# Patient Record
Sex: Female | Born: 1970 | Hispanic: No | Marital: Married | Smoking: Never smoker
Health system: Southern US, Community
[De-identification: ages and names within clinical notes are randomized; demographics above are authoritative.]

## PROBLEM LIST (undated history)

## (undated) DIAGNOSIS — M358 Other specified systemic involvement of connective tissue: Secondary | ICD-10-CM

## (undated) DIAGNOSIS — D649 Anemia, unspecified: Secondary | ICD-10-CM

## (undated) DIAGNOSIS — M3500 Sicca syndrome, unspecified: Secondary | ICD-10-CM

## (undated) DIAGNOSIS — K743 Primary biliary cirrhosis: Secondary | ICD-10-CM

## (undated) DIAGNOSIS — E785 Hyperlipidemia, unspecified: Secondary | ICD-10-CM

## (undated) DIAGNOSIS — L940 Localized scleroderma [morphea]: Secondary | ICD-10-CM

## (undated) HISTORY — DX: Hyperlipidemia, unspecified: E78.5

## (undated) HISTORY — PX: TONSILLECTOMY: SUR1361

## (undated) HISTORY — PX: OTHER SURGICAL HISTORY: SHX169

## (undated) HISTORY — DX: Anemia, unspecified: D64.9

## (undated) HISTORY — DX: Other specified systemic involvement of connective tissue: M35.8

## (undated) HISTORY — DX: Localized scleroderma (morphea): L94.0

## (undated) HISTORY — DX: Sjogren syndrome, unspecified: M35.00

## (undated) HISTORY — PX: CHOLECYSTECTOMY: SHX55

## (undated) HISTORY — DX: Primary biliary cirrhosis: K74.3

---

## 2007-02-15 ENCOUNTER — Ambulatory Visit: Payer: Self-pay

## 2007-02-22 ENCOUNTER — Ambulatory Visit: Payer: Self-pay | Admitting: Obstetrics & Gynecology

## 2007-04-06 LAB — CONVERTED CEMR LAB
Pap Smear: NORMAL
Pap Smear: NORMAL
Pap Smear: NORMAL

## 2008-10-16 ENCOUNTER — Ambulatory Visit: Payer: Self-pay | Admitting: Family Medicine

## 2008-10-16 DIAGNOSIS — E669 Obesity, unspecified: Secondary | ICD-10-CM | POA: Insufficient documentation

## 2008-10-28 IMAGING — CT CT ABD-PELV W/ CM
1 of 2 series · 15 of 32 positions shown, 19 images · non-contrast
Comparison: none

REASON FOR EXAM: Fever Vomiting Eval Appendicitis Call Report [id number] or
after 5 Cell [id number]...
COMMENTS:

[Series 2: appendicitis · axial · 0.78mm/px · z∈[-1048,-580]mm · 15 of 172 slices shown, 19 images]
[im 8/172  soft-tissue]
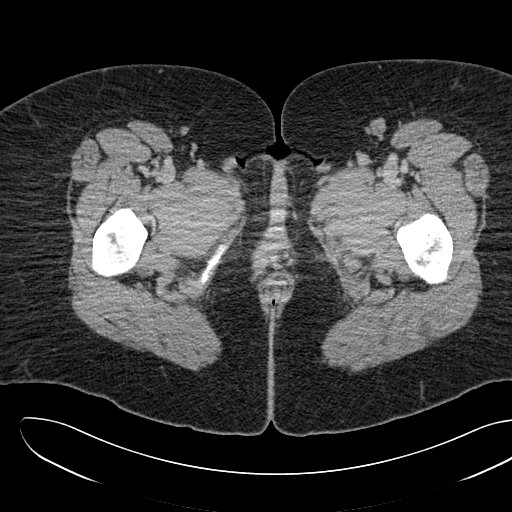
[im 8/172  bone]
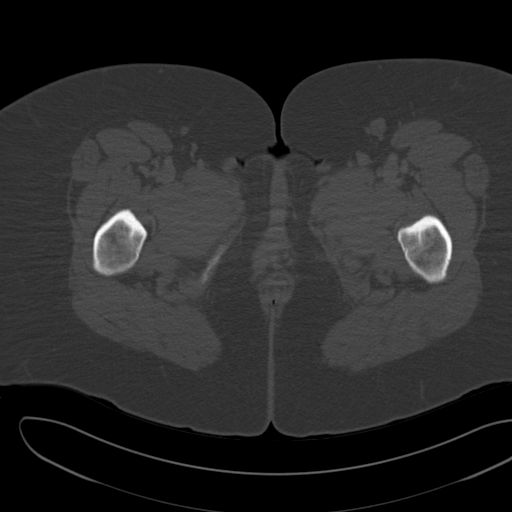
[im 23/172  soft-tissue]
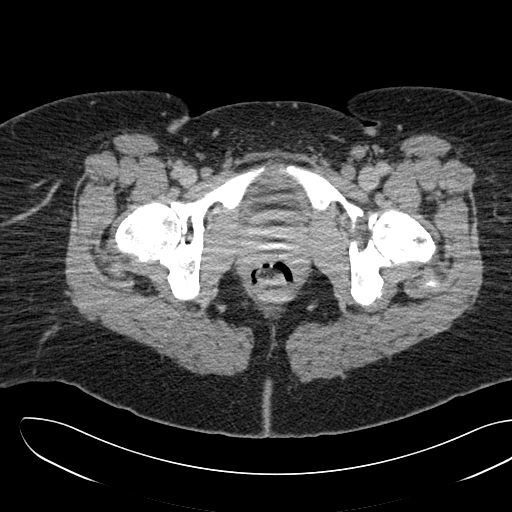
[im 38/172  soft-tissue]
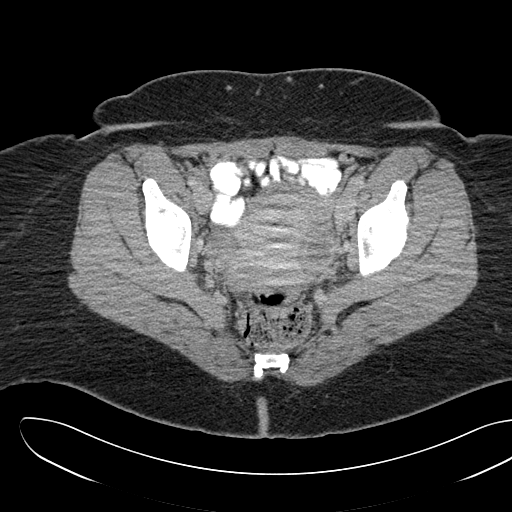
[im 45/172  soft-tissue]
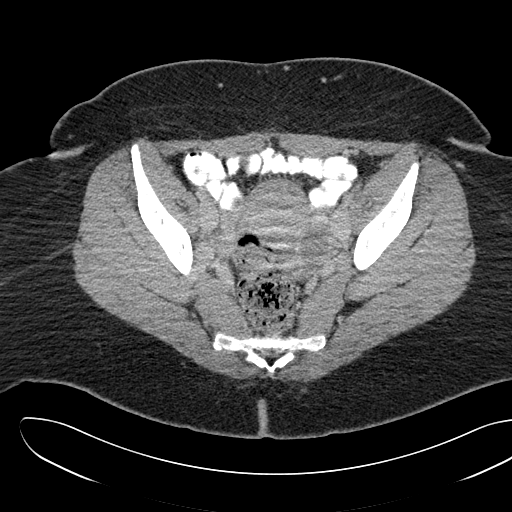
[im 60/172  soft-tissue]
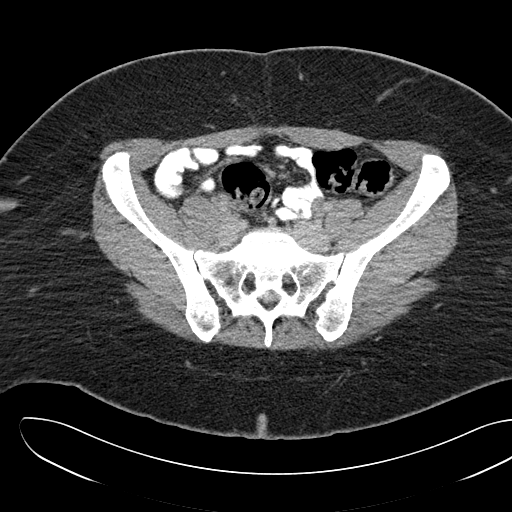
[im 75/172  soft-tissue]
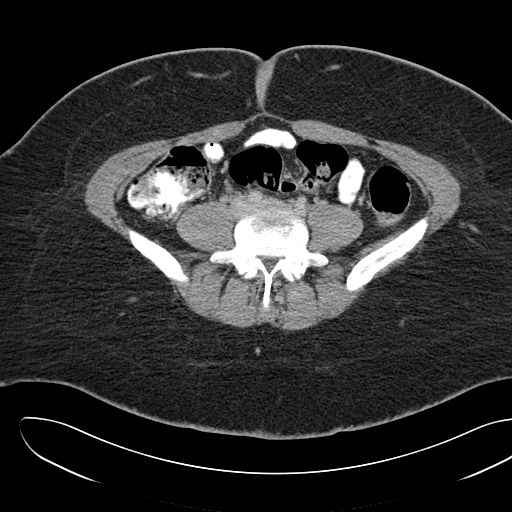
[im 90/172  soft-tissue]
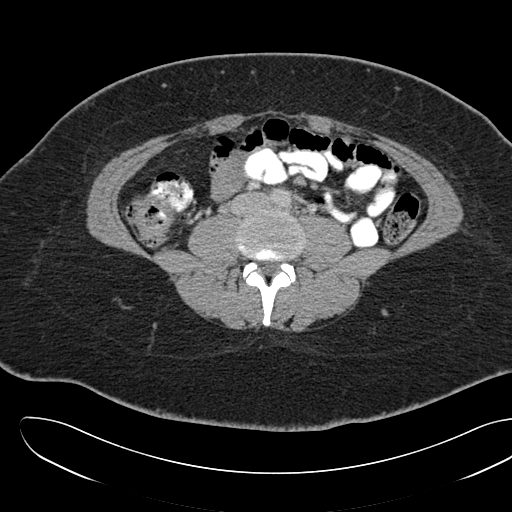
[im 97/172  soft-tissue]
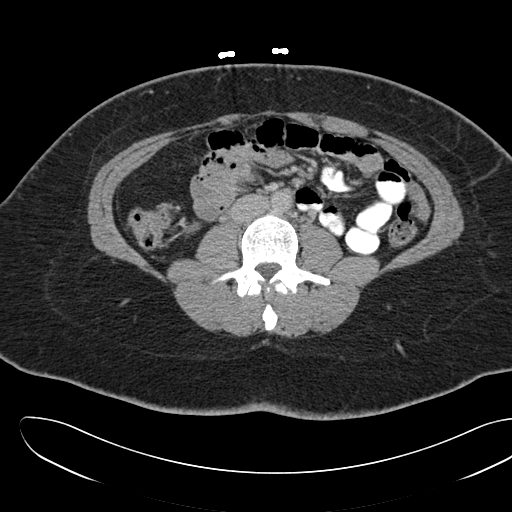
[im 112/172  soft-tissue]
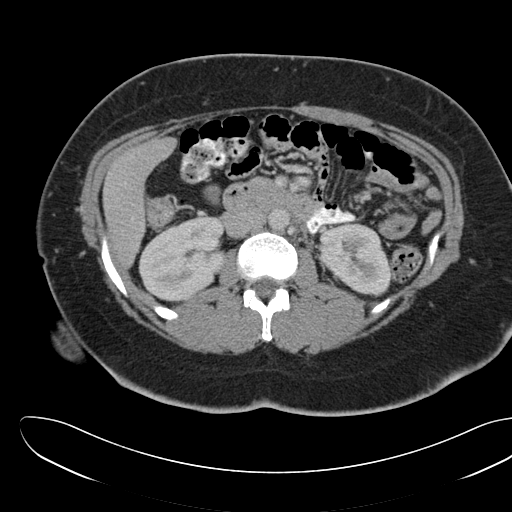
[im 112/172  bone]
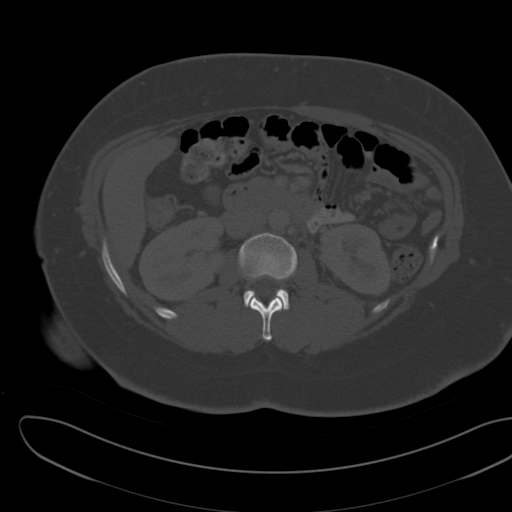
[im 127/172  soft-tissue]
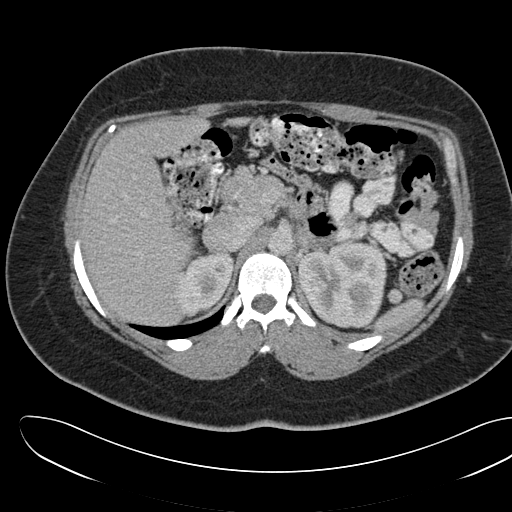
[im 134/172  soft-tissue]
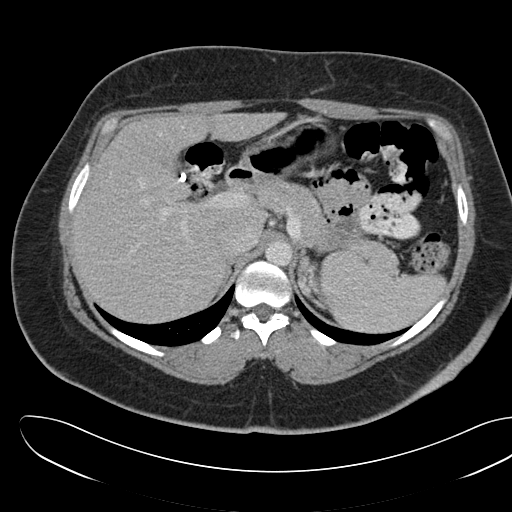
[im 142/172  lung]
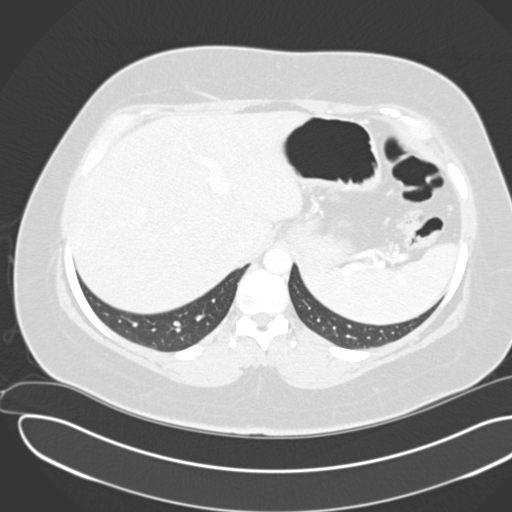
[im 149/172  soft-tissue]
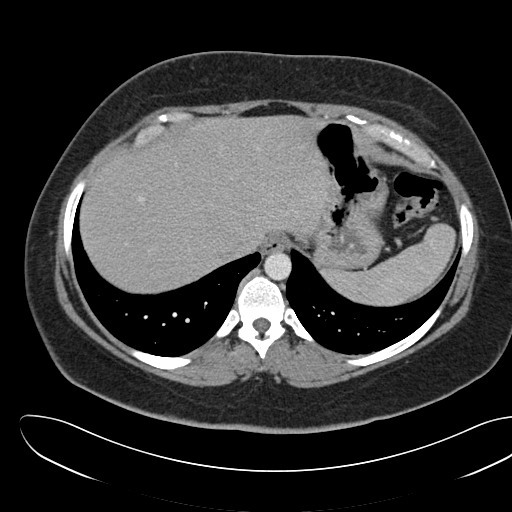
[im 149/172  lung]
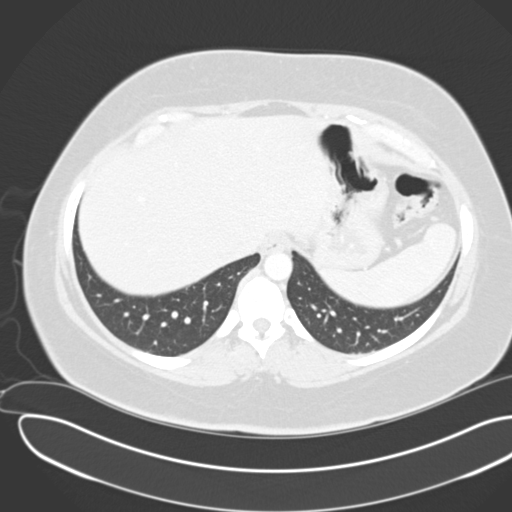
[im 157/172  lung]
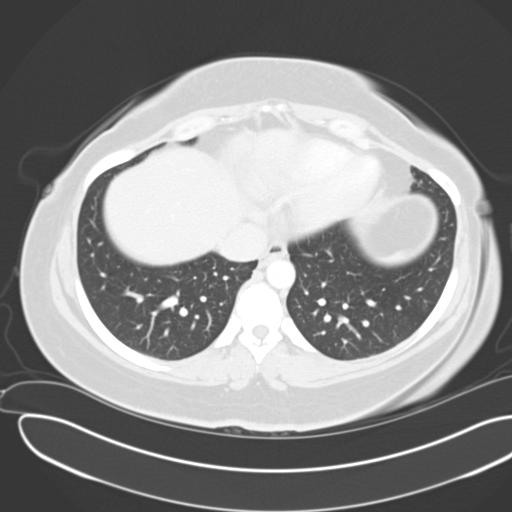
[im 164/172  soft-tissue]
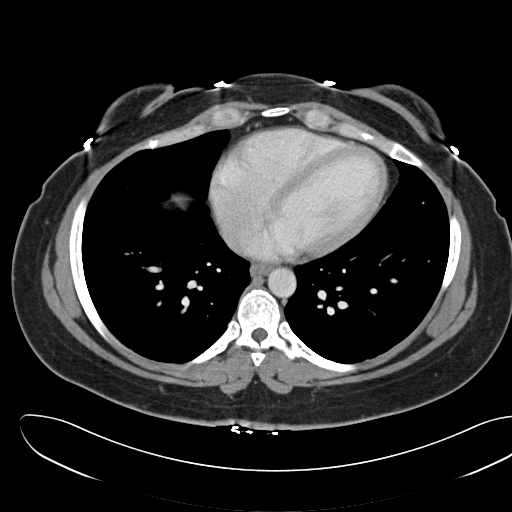
[im 164/172  lung]
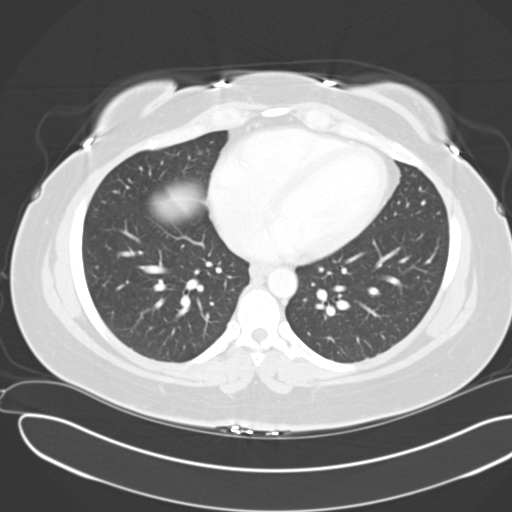

[15 of 32 positions shown; findings below may reference images not displayed]

PROCEDURE:     CT  - CT ABDOMEN / PELVIS  W  - February 15, 2007  [DATE]

RESULT:      Helical 3 mm sections were obtained from the lung bases through
the pubic symphysis status post intravenous administration of 100 ml of
Nsovue-S3U and oral contrast.

Evaluation of the lung bases demonstrates no gross abnormalities.

The liver, spleen and adrenals are unremarkable. Evaluation of the LEFT
kidney demonstrates a 2-3 mm nonobstructing calculus within the inferior
anterior medullary portion of the kidney. No further RIGHT or LEFT renal
calculi or masses are identified. There is no evidence of upper abdominal
free fluid or drainable loculated fluid collections, masses or adenopathy.
No evidence of an abdominal aortic aneurysm is appreciated. Evaluation of
the RIGHT pericolic gutter region demonstrates no CT evidence reflecting
appendicitis. There does not appear to be CT evidence of colitis,
diverticulitis, bowel obstruction or enteritis. A focal low attenuated area
projects within the LEFT adnexal region measuring 3.07 x 3.76 cm likely
representing an ovarian cyst. Pelvic evaluation is degraded secondary to
patient body habitus. No further gross masses, free fluid or drainable
loculated fluid collections are otherwise appreciated within the pelvis.
IMPRESSION: No CT evidence reflecting the sequela of appendicitis,
diverticulitis or colitis. There are findings which appear to be consistent
with a LEFT ovarian cyst.

## 2009-01-29 ENCOUNTER — Ambulatory Visit: Payer: Self-pay | Admitting: Family Medicine

## 2009-01-29 LAB — CONVERTED CEMR LAB
ALT: 19 units/L (ref 0–35)
AST: 18 units/L (ref 0–37)
Bilirubin, Direct: 0 mg/dL (ref 0.0–0.3)
CO2: 29 meq/L (ref 19–32)
Chloride: 109 meq/L (ref 96–112)
Cholesterol: 179 mg/dL (ref 0–200)
Creatinine, Ser: 0.6 mg/dL (ref 0.4–1.2)
Potassium: 4 meq/L (ref 3.5–5.1)
Total CHOL/HDL Ratio: 4
Total Protein: 7.3 g/dL (ref 6.0–8.3)
Triglycerides: 43 mg/dL (ref 0.0–149.0)

## 2009-02-12 ENCOUNTER — Other Ambulatory Visit: Admission: RE | Admit: 2009-02-12 | Discharge: 2009-02-12 | Payer: Self-pay | Admitting: Family Medicine

## 2009-02-12 ENCOUNTER — Encounter: Payer: Self-pay | Admitting: Family Medicine

## 2009-02-12 ENCOUNTER — Ambulatory Visit: Payer: Self-pay | Admitting: Family Medicine

## 2009-02-12 DIAGNOSIS — R5383 Other fatigue: Secondary | ICD-10-CM

## 2009-02-12 DIAGNOSIS — B373 Candidiasis of vulva and vagina: Secondary | ICD-10-CM

## 2009-02-12 DIAGNOSIS — B3731 Acute candidiasis of vulva and vagina: Secondary | ICD-10-CM | POA: Insufficient documentation

## 2009-02-12 DIAGNOSIS — R5381 Other malaise: Secondary | ICD-10-CM | POA: Insufficient documentation

## 2009-02-15 LAB — CONVERTED CEMR LAB
Basophils Absolute: 0 10*3/uL (ref 0.0–0.1)
Basophils Relative: 0 % (ref 0–1)
Eosinophils Absolute: 0 10*3/uL (ref 0.0–0.7)
Eosinophils Relative: 0 % (ref 0–5)
Lymphs Abs: 1.5 10*3/uL (ref 0.7–4.0)
MCHC: 32.6 g/dL (ref 30.0–36.0)
MCV: 88 fL (ref 78.0–100.0)
Neutrophils Relative %: 64 % (ref 43–77)
Platelets: 231 10*3/uL (ref 150–400)
RDW: 14.3 % (ref 11.5–15.5)
WBC: 5.3 10*3/uL (ref 4.0–10.5)

## 2009-02-23 ENCOUNTER — Encounter (INDEPENDENT_AMBULATORY_CARE_PROVIDER_SITE_OTHER): Payer: Self-pay | Admitting: *Deleted

## 2009-07-29 ENCOUNTER — Ambulatory Visit: Payer: Self-pay | Admitting: Family Medicine

## 2009-07-29 DIAGNOSIS — J020 Streptococcal pharyngitis: Secondary | ICD-10-CM | POA: Insufficient documentation

## 2009-07-29 DIAGNOSIS — IMO0002 Reserved for concepts with insufficient information to code with codable children: Secondary | ICD-10-CM | POA: Insufficient documentation

## 2010-07-05 NOTE — Assessment & Plan Note (Signed)
Summary: BACK PAIN/CLE   Vital Signs:  Patient profile:   40 year old female Weight:      284 pounds Temp:     98.7 degrees F oral Pulse rate:   72 / minute Pulse rhythm:   regular BP sitting:   130 / 80  (left arm) Cuff size:   large  Vitals Entered By: Sydell Axon LPN (July 29, 2009 3:59 PM) CC: Woke up Sunday morning with back pain, has been using ice and Aleve   History of Present Illness: Pt here  for low back pain and right radiculopathy into posterior thigh. She has never had back pain before. This started Sun. She is overweight and we discussed the pathophysiology of that and COG. She had been catrrying her two toddler grandchildren on each hip most of Sat as eveeryone else was sick or unavailabloe to care for them. She felt well when she went to bed Sat nite buthad signif discomfort upon arising Sun AM. She had done nothing else unusual, did not fall and has had no trauma. She has been doing nothing and taking nothing for it.  Problems Prior to Update: 1)  Routine Gynecological Examination  (ICD-V72.31) 2)  Physical Examination  (ICD-V70.0) 3)  Vaginitis, Candidal  (ICD-112.1) 4)  Fatigue  (ICD-780.79) 5)  Morbid Obesity  (ICD-278.01)  Medications Prior to Update: 1)  Adipex-P 37.5 Mg Caps (Phentermine Hcl) .... Take 1 Tablet By Mouth Once A Day 2)  Multivitamins   Tabs (Multiple Vitamin) .... Take 1 Tablet By Mouth Once A Day 3)  Amoxicillin 500 Mg Caps (Amoxicillin) .... Since 02-05-2009 For Infection 4)  Fluconazole 150 Mg Tabs (Fluconazole) .Marland Kitchen.. 1 Tab By Mouth X 1  Allergies: No Known Drug Allergies  Physical Exam  General:  Well-developed,well-nourished,in no acute distress; alert,appropriate and cooperative throughout examination, obese and leaning over the exam table when I entered the room. Msk:  Very difficult for her to stand up straight. She can get upright with somer distress. ROM at waist to 90 degrees in flexion, just upright in extension, lat bend  left to 30 degrees, right to 5 degrees at most, twist to 30 degrees bilat. Barely able to get on exam table but unable to sit any time, declined trying to lay down. Descibes pain like "pins and needles" into the posterior right thigh. Neurologic:  Unable to assess.   Impression & Recommendations:  Problem # 1:  BACK PAIN, LUMBAR, WITH RADICULOPATHY (ICD-724.4) Assessment New  Long discussion of pathophysiology and trmt. Use IBP and Flexeril three times a day together. Use heat during the day and ice at night. Start exercises shown when able. Take Vicodin as needed. RTC if sxs worsen. The following medications were removed from the medication list:    Aleve 220 Mg Tabs (Naproxen sodium) .Marland Kitchen... As needed Her updated medication list for this problem includes:    Vicodin 5-500 Mg Tabs (Hydrocodone-acetaminophen) ..... One tab by mouth three times a day as needed    Cyclobenzaprine Hcl 10 Mg Tabs (Cyclobenzaprine hcl) ..... One tab by mouth three times a day    Ibuprofen 800 Mg Tabs (Ibuprofen) ..... One tab by mouth three times a day with food.  Complete Medication List: 1)  Adipex-p 37.5 Mg Caps (Phentermine hcl) .... Take 1 tablet by mouth once a day 2)  Multivitamins Tabs (Multiple vitamin) .... Take 1 tablet by mouth once a day 3)  Fluconazole 150 Mg Tabs (Fluconazole) .Marland Kitchen.. 1 tab by mouth x 1 4)  Vicodin 5-500 Mg Tabs (Hydrocodone-acetaminophen) .... One tab by mouth three times a day as needed 5)  Cyclobenzaprine Hcl 10 Mg Tabs (Cyclobenzaprine hcl) .... One tab by mouth three times a day 6)  Ibuprofen 800 Mg Tabs (Ibuprofen) .... One tab by mouth three times a day with food. Prescriptions: IBUPROFEN 800 MG TABS (IBUPROFEN) one tab by mouth three times a day with food.  #90 x 0   Entered and Authorized by:   Shaune Leeks MD   Signed by:   Shaune Leeks MD on 07/29/2009   Method used:   Electronically to        [facility] [address] (retail)       [address]  [address]       [place], [place]  [address]       Ph: [phone]       Fax: [phone]   RxID:   [id number] CYCLOBENZAPRINE HCL 10 MG TABS (CYCLOBENZAPRINE HCL) one tab by mouth three times a day  #90 x 1   Entered and Authorized by:   Shaune Leeks MD   Signed by:   Shaune Leeks MD on 07/29/2009   Method used:   Electronically to        [facility] [address] (retail)       [address], [address]       [place], [place]  [address]       Ph: [phone]       Fax: [phone]   RxID:   [id number] VICODIN 5-500 MG TABS (HYDROCODONE-ACETAMINOPHEN) ONE TAB by mouth three times a day as needed  #30 x 0   Entered and Authorized by:   Shaune Leeks MD   Signed by:   Shaune Leeks MD on 07/29/2009   Method used:   Print then Give to Patient   RxID:   [id number]   Current Allergies (reviewed today): No known allergies

## 2010-07-12 ENCOUNTER — Encounter: Payer: Self-pay | Admitting: Family Medicine

## 2010-07-27 NOTE — Letter (Signed)
Summary: [facility] Consultation  [facility] Consultation   Imported By: Kassie Mends 07/19/2010 09:31:22  _____________________________________________________________________  External Attachment:    Type:   Image     Comment:   External Document

## 2012-07-04 ENCOUNTER — Ambulatory Visit (INDEPENDENT_AMBULATORY_CARE_PROVIDER_SITE_OTHER): Payer: BC Managed Care – PPO | Admitting: Family Medicine

## 2012-07-04 ENCOUNTER — Telehealth: Payer: Self-pay | Admitting: *Deleted

## 2012-07-04 ENCOUNTER — Encounter: Payer: Self-pay | Admitting: Family Medicine

## 2012-07-04 VITALS — BP 120/84 | HR 86 | Temp 97.6°F | Ht 69.0 in

## 2012-07-04 DIAGNOSIS — L989 Disorder of the skin and subcutaneous tissue, unspecified: Secondary | ICD-10-CM

## 2012-07-04 MED ORDER — CEPHALEXIN 500 MG PO CAPS: 500.0000 mg | ORAL_CAPSULE | Freq: Three times a day (TID) | ORAL | Status: DC

## 2012-07-04 NOTE — Telephone Encounter (Signed)
error 

## 2012-07-04 NOTE — Progress Notes (Signed)
  Subjective:    Patient ID: Jessica Aguirre, female    DOB: Nov 17, 1970, 42 y.o.   MRN: [id number]  HPI  42 year old female presents with injury  To 4th digit 3 weeks ago, hit vaccuuming out the car.  Immediately was painful bleeding, scratch, in next 24 hours swelled up.  She took a needle heated end inserted it, no fluid came out. Since then redness has increased and now spread to other fingers on that hand. Area are itchy and hot, not tender.  She has been using hand sanitizer and has been washing hands. Has been applying neosporin and bandaids.  No fever. No N/V.   No similar rash in past.    Review of Systems  Constitutional: Negative for fever and fatigue.  HENT: Negative for ear pain.   Eyes: Negative for pain.  Respiratory: Negative for chest tightness and shortness of breath.   Cardiovascular: Negative for chest pain, palpitations and leg swelling.  Gastrointestinal: Negative for abdominal pain.  Genitourinary: Negative for dysuria.       Objective:   Physical Exam  Constitutional: She is oriented to person, place, and time. She appears well-developed and well-nourished.       obese  HENT:  Head: Normocephalic.  Eyes: Conjunctivae normal are normal. Pupils are equal, round, and reactive to light.  Cardiovascular: Normal rate and intact distal pulses.  Exam reveals no friction rub.   No murmur heard. Pulmonary/Chest: Effort normal and breath sounds normal. No respiratory distress. She has no wheezes. She has no rales. She exhibits no tenderness.  Neurological: She is alert and oriented to person, place, and time.  Skin:       Right hand below nail on 4th digit erythematous swelling and nodule, no fluctuance, several other nodules red  On other fingers 3-1 not associated with joints.  Mild diffuse swelling on right hand Pulse 2 plus radial B.          Assessment & Plan:

## 2012-07-04 NOTE — Patient Instructions (Addendum)
Start and complete the antibiotics, keflex. Hold neosporin. Apply OTC cortisone 10 cream twice daily. Elevate hand above heart when able. Follow up in 1 week for re-evaluation.  Call sooner if spreading , worsening or fever, or not tolerating antibiotics.

## 2012-07-04 NOTE — Assessment & Plan Note (Signed)
Concerning for infeciton but unusual in way there has been spread of nodules to other fingers. Will treat with keflex, use low dose steroid and follow up closely.  May need to treat with stronger steroid if not improving.

## 2012-07-12 ENCOUNTER — Ambulatory Visit (INDEPENDENT_AMBULATORY_CARE_PROVIDER_SITE_OTHER): Payer: BC Managed Care – PPO | Admitting: Family Medicine

## 2012-07-12 ENCOUNTER — Encounter: Payer: Self-pay | Admitting: Family Medicine

## 2012-07-12 VITALS — BP 120/72 | HR 78 | Temp 97.7°F | Ht 69.0 in

## 2012-07-12 DIAGNOSIS — L989 Disorder of the skin and subcutaneous tissue, unspecified: Secondary | ICD-10-CM

## 2012-07-12 NOTE — Patient Instructions (Addendum)
Stop by front desk to set up referral to [facility].

## 2012-07-12 NOTE — Progress Notes (Signed)
  Subjective:    Patient ID: Jessica Aguirre, female    DOB: 01/17/1971, 42 y.o.   MRN: [id number]  HPI  42 year old female with possible cellulitis on right hand. Treated with keflex x 7 days, completed.  Today she reports that the lesions are no better. She has now noted a spread to her right TOE, 3rd digit.  No fever. There is no local worsening of hand lesions. She also used corticonse OTC but lesions are still itchy.   No N/V.  No joint pain. No mouth or oral lesions.  No family with similar issue. No sick contacts.   Review of Systems  Constitutional: Negative for fever and fatigue.  HENT: Negative for ear pain.   Eyes: Negative for pain.  Respiratory: Negative for chest tightness and shortness of breath.   Cardiovascular: Negative for chest pain, palpitations and leg swelling.  Gastrointestinal: Negative for abdominal pain.  Genitourinary: Negative for dysuria.       Objective:   Physical Exam  Constitutional: She is oriented to person, place, and time. Vital signs are normal. She appears well-developed and well-nourished. She is cooperative.  Non-toxic appearance. She does not appear ill. No distress.       obese  HENT:  Head: Normocephalic.  Right Ear: Hearing, tympanic membrane, external ear and ear canal normal. Tympanic membrane is not erythematous, not retracted and not bulging.  Left Ear: Hearing, tympanic membrane, external ear and ear canal normal. Tympanic membrane is not erythematous, not retracted and not bulging.  Nose: No mucosal edema or rhinorrhea. Right sinus exhibits no maxillary sinus tenderness and no frontal sinus tenderness. Left sinus exhibits no maxillary sinus tenderness and no frontal sinus tenderness.  Mouth/Throat: Uvula is midline, oropharynx is clear and moist and mucous membranes are normal.  Eyes: Conjunctivae normal, EOM and lids are normal. Pupils are equal, round, and reactive to light. No foreign bodies found.  Neck: Trachea normal and  normal range of motion. Neck supple. Carotid bruit is not present. No mass and no thyromegaly present.  Cardiovascular: Normal rate, regular rhythm, S1 normal, S2 normal, normal heart sounds, intact distal pulses and normal pulses.  Exam reveals no gallop and no friction rub.   No murmur heard. Pulmonary/Chest: Effort normal and breath sounds normal. Not tachypneic. No respiratory distress. She has no decreased breath sounds. She has no wheezes. She has no rhonchi. She has no rales. She exhibits no tenderness.  Abdominal: Soft. Normal appearance and bowel sounds are normal. There is no tenderness.  Neurological: She is alert and oriented to person, place, and time.  Skin: Skin is warm, dry and intact. No rash noted.       Right hand below nail on 4th digit erythematous swelling and nodule, no fluctuance, several other nodules red  On other fingers 3-1 not associated with joints.  Mild diffuse swelling on right hand Similar lesion on toe Pulse 2 plus radial B.  Psychiatric: Her speech is normal and behavior is normal. Judgment and thought content normal. Her mood appears not anxious. Cognition and memory are normal. She does not exhibit a depressed mood.          Assessment & Plan:

## 2012-07-12 NOTE — Assessment & Plan Note (Signed)
No improvement with antibiotics.. Doubt infection at this point. No consistent with hand foot mouth, not clearly dyshidrotic eczema.  Given unclear diagnosis.. Will refer to [facility] for further eval.

## 2013-01-09 ENCOUNTER — Ambulatory Visit (INDEPENDENT_AMBULATORY_CARE_PROVIDER_SITE_OTHER): Payer: BC Managed Care – PPO | Admitting: Family Medicine

## 2013-01-09 ENCOUNTER — Other Ambulatory Visit (HOSPITAL_COMMUNITY)
Admission: RE | Admit: 2013-01-09 | Discharge: 2013-01-09 | Disposition: A | Discharge status: Home / Self Care | Payer: BC Managed Care – PPO | Source: Ambulatory Visit | Attending: Family Medicine | Admitting: Family Medicine

## 2013-01-09 ENCOUNTER — Encounter: Payer: Self-pay | Admitting: Family Medicine

## 2013-01-09 ENCOUNTER — Telehealth: Payer: Self-pay | Admitting: Family Medicine

## 2013-01-09 VITALS — BP 112/80 | HR 72 | Temp 98.0°F | Ht 69.0 in | Wt 315.0 lb

## 2013-01-09 DIAGNOSIS — Z1231 Encounter for screening mammogram for malignant neoplasm of breast: Secondary | ICD-10-CM

## 2013-01-09 DIAGNOSIS — I7300 Raynaud's syndrome without gangrene: Secondary | ICD-10-CM

## 2013-01-09 DIAGNOSIS — E7800 Pure hypercholesterolemia, unspecified: Secondary | ICD-10-CM | POA: Insufficient documentation

## 2013-01-09 DIAGNOSIS — Z13220 Encounter for screening for lipoid disorders: Secondary | ICD-10-CM

## 2013-01-09 DIAGNOSIS — Z01419 Encounter for gynecological examination (general) (routine) without abnormal findings: Secondary | ICD-10-CM | POA: Insufficient documentation

## 2013-01-09 DIAGNOSIS — Z1151 Encounter for screening for human papillomavirus (HPV): Secondary | ICD-10-CM | POA: Insufficient documentation

## 2013-01-09 DIAGNOSIS — M3500 Sicca syndrome, unspecified: Secondary | ICD-10-CM

## 2013-01-09 DIAGNOSIS — Z0000 Encounter for general adult medical examination without abnormal findings: Secondary | ICD-10-CM

## 2013-01-09 DIAGNOSIS — R7989 Other specified abnormal findings of blood chemistry: Secondary | ICD-10-CM

## 2013-01-09 LAB — COMPREHENSIVE METABOLIC PANEL
Alkaline Phosphatase: 70 U/L (ref 39–117)
BUN: 10 mg/dL (ref 6–23)
Creatinine, Ser: 0.7 mg/dL (ref 0.4–1.2)
Glucose, Bld: 85 mg/dL (ref 70–99)
Total Bilirubin: 0.8 mg/dL (ref 0.3–1.2)

## 2013-01-09 LAB — LIPID PANEL
Cholesterol: 211 mg/dL — ABNORMAL HIGH (ref 0–200)
HDL: 55.4 mg/dL (ref >39.00)
Total CHOL/HDL Ratio: 4
Triglycerides: 64 mg/dL (ref 0.0–149.0)
VLDL: 12.8 mg/dL (ref 0.0–40.0)

## 2013-01-09 NOTE — Telephone Encounter (Signed)
Notify pt that labs look nml except one liver function test has been elevated. May be due to medication ( plaqeunil). Does she drink alcohol or has had transfusions? No DM. Cholesterol is elevated... Mail info on low chol diet, exercise and weight loss. Have her schedule repeat lipids in 3 months Dx 272.0 We will check LFTs again at that point.

## 2013-01-09 NOTE — Patient Instructions (Addendum)
Continue working on healthy eating, weight loss  and exercise.  Stop at lab on way out. Stop at front desk to set up mammogram

## 2013-01-09 NOTE — Progress Notes (Signed)
Subjective:    Patient ID: Jessica Aguirre, female    DOB: [birth year], 42 y.o.   MRN: [id number]  HPI The patient is here for annual wellness exam and preventative care.     In last few years weight gain. About 30 lbs. Morbidly obese Wt Readings from Last 3 Encounters:  01/09/13 315 lb (142.883 kg)  07/29/09 284 lb (128.822 kg)  02/12/09 270 lb 6.4 oz (122.653 kg)   Due for lab re-eval. Exercise 4-5 times a week. Decreasing carbs and trying to eat fruit and veggies.   Seasonal affective disorder: stable on prozac.  Diagnosed with Sjogren's syndrome. Now on plaquenil for this. On nifedipine for raynaud's syndrome.  Review of Systems  Constitutional: Negative for fever, fatigue and unexpected weight change.  HENT: Negative for ear pain, congestion, sore throat, sneezing, trouble swallowing and sinus pressure.   Eyes: Negative for pain and itching.  Respiratory: Negative for cough, shortness of breath and wheezing.   Cardiovascular: Negative for chest pain, palpitations and leg swelling.  Gastrointestinal: Negative for nausea, abdominal pain, diarrhea, constipation and blood in stool.  Genitourinary: Negative for dysuria, hematuria, vaginal discharge, difficulty urinating and menstrual problem.  Skin: Negative for rash.  Neurological: Negative for syncope, weakness, light-headedness, numbness and headaches.  Psychiatric/Behavioral: Negative for confusion and dysphoric mood. The patient is not nervous/anxious.        Objective:   Physical Exam  Constitutional: Vital signs are normal. She appears well-developed and well-nourished. She is cooperative.  Non-toxic appearance. She does not appear ill. No distress.  Morbidly obese  HENT:  Head: Normocephalic.  Right Ear: Hearing, tympanic membrane, external ear and ear canal normal.  Left Ear: Hearing, tympanic membrane, external ear and ear canal normal.  Nose: Nose normal.  Eyes: Conjunctivae, EOM and lids are normal. Pupils  are equal, round, and reactive to light. No foreign bodies found.  Neck: Trachea normal and normal range of motion. Neck supple. Carotid bruit is not present. No mass and no thyromegaly present.  Cardiovascular: Normal rate, regular rhythm, S1 normal, S2 normal, normal heart sounds and intact distal pulses.  Exam reveals no gallop.   No murmur heard. Pulmonary/Chest: Effort normal and breath sounds normal. No respiratory distress. She has no wheezes. She has no rhonchi. She has no rales.  Abdominal: Soft. Normal appearance and bowel sounds are normal. She exhibits no distension, no fluid wave, no abdominal bruit and no mass. There is no hepatosplenomegaly. There is no tenderness. There is no rebound, no guarding and no CVA tenderness. No hernia.  Genitourinary: Vagina normal and uterus normal. No breast swelling, tenderness, discharge or bleeding. Pelvic exam was performed with patient prone. There is no rash, tenderness or lesion on the right labia. There is no rash, tenderness or lesion on the left labia. Uterus is not enlarged and not tender. Cervix exhibits no motion tenderness, no discharge and no friability. Right adnexum displays no mass, no tenderness and no fullness. Left adnexum displays no mass, no tenderness and no fullness.  Lymphadenopathy:    She has no cervical adenopathy.    She has no axillary adenopathy.  Neurological: She is alert. She has normal strength. No cranial nerve deficit or sensory deficit.  Skin: Skin is warm, dry and intact. No rash noted.  Psychiatric: Her speech is normal and behavior is normal. Judgment normal. Her mood appears not anxious. Cognition and memory are normal. She does not exhibit a depressed mood.          Assessment &  Plan:  The patient's preventative maintenance and recommended screening tests for an annual wellness exam were reviewed in full today. Brought up to date unless services declined.  Counselled on the importance of diet, exercise,  and its role in overall health and mortality. The patient's FH and SH was reviewed, including their home life, tobacco status, and drug and alcohol status.   Due for pap/DVE  Mammo: due  Vaccines: uptodate , due 2015 nonsmoker

## 2013-01-09 NOTE — Addendum Note (Signed)
Addended by: Eliezer Bottom on: 01/09/2013 12:31 PM   Modules accepted: Orders

## 2013-01-10 NOTE — Telephone Encounter (Signed)
Advised patient of lab results.  She say liver functions are always elevated due to her Sjogren's. Follow up lab appt scheduled.

## 2013-01-15 ENCOUNTER — Encounter: Payer: Self-pay | Admitting: *Deleted

## 2013-01-15 ENCOUNTER — Encounter: Payer: Self-pay | Admitting: Family Medicine

## 2013-01-24 ENCOUNTER — Ambulatory Visit
Admission: RE | Admit: 2013-01-24 | Discharge: 2013-01-24 | Disposition: A | Discharge status: Home / Self Care | Payer: BC Managed Care – PPO | Source: Ambulatory Visit | Attending: Family Medicine | Admitting: Family Medicine

## 2013-01-24 DIAGNOSIS — Z1231 Encounter for screening mammogram for malignant neoplasm of breast: Secondary | ICD-10-CM

## 2013-01-28 ENCOUNTER — Encounter: Payer: Self-pay | Admitting: *Deleted

## 2013-01-28 ENCOUNTER — Encounter: Payer: Self-pay | Admitting: Family Medicine

## 2013-01-30 ENCOUNTER — Encounter: Payer: Self-pay | Admitting: Family Medicine

## 2013-01-30 ENCOUNTER — Ambulatory Visit (INDEPENDENT_AMBULATORY_CARE_PROVIDER_SITE_OTHER): Payer: BC Managed Care – PPO | Admitting: Family Medicine

## 2013-01-30 VITALS — BP 102/70 | HR 91 | Temp 98.2°F | Ht 69.0 in | Wt 321.5 lb

## 2013-01-30 DIAGNOSIS — J029 Acute pharyngitis, unspecified: Secondary | ICD-10-CM | POA: Insufficient documentation

## 2013-01-30 DIAGNOSIS — R112 Nausea with vomiting, unspecified: Secondary | ICD-10-CM | POA: Insufficient documentation

## 2013-01-30 LAB — POCT RAPID STREP A (OFFICE): Rapid Strep A Screen: NEGATIVE

## 2013-01-30 LAB — CBC WITH DIFFERENTIAL/PLATELET
Basophils Absolute: 0 10*3/uL (ref 0.0–0.1)
Hemoglobin: 12.7 g/dL (ref 12.0–15.0)
Lymphocytes Relative: 33.7 % (ref 12.0–46.0)
Monocytes Relative: 7.1 % (ref 3.0–12.0)
Platelets: 276 10*3/uL (ref 150.0–400.0)
RDW: 14 % (ref 11.5–14.6)
WBC: 5.9 10*3/uL (ref 4.5–10.5)

## 2013-01-30 LAB — COMPREHENSIVE METABOLIC PANEL
ALT: 39 U/L — ABNORMAL HIGH (ref 0–35)
CO2: 29 mEq/L (ref 19–32)
Calcium: 8.6 mg/dL (ref 8.4–10.5)
Chloride: 102 mEq/L (ref 96–112)
Creatinine, Ser: 0.7 mg/dL (ref 0.4–1.2)
GFR: 105.02 mL/min (ref >60.00)
Glucose, Bld: 85 mg/dL (ref 70–99)
Total Protein: 7.6 g/dL (ref 6.0–8.3)

## 2013-01-30 NOTE — Assessment & Plan Note (Signed)
Possibly due to viral syndrome. Now resolved.

## 2013-01-30 NOTE — Patient Instructions (Addendum)
Stop at lab on your way out. Use ibuprofen for throat pain if tolerated.  We will call with labs tonight or tomorrow.

## 2013-01-30 NOTE — Progress Notes (Signed)
  Subjective:    Patient ID: Jessica Aguirre, female    DOB: October 01, 1970, 42 y.o.   MRN: [id number]  HPI  42 year old female with Sjogren's syndrome presents with  N/V in the last 2 days.  She threw up all day Tuesday, stopped that evening but then nausea recurred yesterday.  Now with right ear pain, right anterior neck pain. Still nauseous, no vomiting since first day. No congestion, no sinus pressure.  Throat and nose sore folowing emesis since some came out nose.  No abdominal pain,no Fever, no D/C.  No recent med change.  No questionable food. No stream water, no travel.    Review of Systems  Constitutional: Positive for fatigue. Negative for fever.  HENT: Positive for ear pain. Negative for rhinorrhea, sneezing, postnasal drip and sinus pressure.   Eyes: Negative for pain.  Respiratory: Negative for chest tightness and shortness of breath.   Cardiovascular: Negative for chest pain, palpitations and leg swelling.  Gastrointestinal: Negative for abdominal pain.  Genitourinary: Negative for dysuria.       Objective:   Physical Exam  Constitutional: Vital signs are normal. She appears well-developed and well-nourished. She is cooperative.  Non-toxic appearance. She does not appear ill. No distress.  HENT:  Head: Normocephalic.  Right Ear: Hearing, tympanic membrane, external ear and ear canal normal. Tympanic membrane is not erythematous, not retracted and not bulging.  Left Ear: Hearing, tympanic membrane, external ear and ear canal normal. Tympanic membrane is not erythematous, not retracted and not bulging.  Nose: No mucosal edema or rhinorrhea. Right sinus exhibits no maxillary sinus tenderness and no frontal sinus tenderness. Left sinus exhibits no maxillary sinus tenderness and no frontal sinus tenderness.  Mouth/Throat: Uvula is midline and mucous membranes are normal. Posterior oropharyngeal erythema present. No oropharyngeal exudate or tonsillar abscesses.  Eyes:  Conjunctivae, EOM and lids are normal. Pupils are equal, round, and reactive to light. Lids are everted and swept, no foreign bodies found.  Neck: Trachea normal and normal range of motion. Neck supple. Carotid bruit is not present. No mass and no thyromegaly present.  Cardiovascular: Normal rate, regular rhythm, S1 normal, S2 normal, normal heart sounds, intact distal pulses and normal pulses.  Exam reveals no gallop and no friction rub.   No murmur heard. Pulmonary/Chest: Effort normal and breath sounds normal. Not tachypneic. No respiratory distress. She has no decreased breath sounds. She has no wheezes. She has no rhonchi. She has no rales.  Abdominal: Soft. Normal appearance and bowel sounds are normal. There is no tenderness.  Lymphadenopathy:       Head (right side): Tonsillar adenopathy present. No submental, no submandibular, no preauricular, no posterior auricular and no occipital adenopathy present.       Head (left side): No submental, no submandibular, no tonsillar, no preauricular, no posterior auricular and no occipital adenopathy present.    She has no cervical adenopathy.  Neurological: She is alert.  Skin: Skin is warm, dry and intact. No rash noted.  Psychiatric: Her speech is normal and behavior is normal. Judgment and thought content normal. Her mood appears not anxious. Cognition and memory are normal. She does not exhibit a depressed mood.          Assessment & Plan:

## 2013-01-30 NOTE — Assessment & Plan Note (Addendum)
Likely viral pharyngitis with associated lymphadenopathy. Rapid strep negative.  Recommend  eval with wbc given on immunosuppressants plaquenil.

## 2013-01-31 ENCOUNTER — Ambulatory Visit: Payer: BC Managed Care – PPO

## 2013-02-20 DIAGNOSIS — Z9884 Bariatric surgery status: Secondary | ICD-10-CM | POA: Insufficient documentation

## 2013-04-07 ENCOUNTER — Other Ambulatory Visit: Payer: BC Managed Care – PPO

## 2013-04-10 ENCOUNTER — Other Ambulatory Visit: Payer: Self-pay

## 2013-10-24 ENCOUNTER — Encounter: Payer: Self-pay | Admitting: Family Medicine

## 2013-10-24 ENCOUNTER — Ambulatory Visit (INDEPENDENT_AMBULATORY_CARE_PROVIDER_SITE_OTHER): Payer: BC Managed Care – PPO | Admitting: Family Medicine

## 2013-10-24 VITALS — BP 100/72 | HR 71 | Temp 98.9°F | Ht 69.0 in | Wt 218.2 lb

## 2013-10-24 DIAGNOSIS — B9789 Other viral agents as the cause of diseases classified elsewhere: Principal | ICD-10-CM

## 2013-10-24 DIAGNOSIS — J069 Acute upper respiratory infection, unspecified: Secondary | ICD-10-CM | POA: Insufficient documentation

## 2013-10-24 MED ORDER — BENZONATATE 200 MG PO CAPS: 200.0000 mg | ORAL_CAPSULE | Freq: Two times a day (BID) | ORAL | Status: DC | PRN

## 2013-10-24 MED ORDER — GUAIFENESIN-CODEINE 100-10 MG/5ML PO SYRP: 5.0000 mL | ORAL_SOLUTION | Freq: Every evening | ORAL | Status: DC | PRN

## 2013-10-24 NOTE — Progress Notes (Signed)
   Subjective:    Patient ID: Jessica Aguirre, female    DOB: [birth year], 43 y.o.   MRN: [id number]  Cough This is a new problem. The current episode started in the past 7 days. The problem has been unchanged. The cough is non-productive. Associated symptoms include chest pain, a sore throat and shortness of breath. Pertinent negatives include no ear congestion, ear pain, fever, headaches, myalgias, nasal congestion, postnasal drip, rash, rhinorrhea or wheezing. The symptoms are aggravated by lying down (keeping her up at night). Risk factors: non smoker. Treatments tried: mucinex, alkaseltzer cold. The treatment provided no relief. There is no history of asthma, bronchiectasis, COPD, emphysema or environmental allergies.      Review of Systems  Constitutional: Negative for fever.  HENT: Positive for sore throat. Negative for ear pain, postnasal drip and rhinorrhea.   Respiratory: Positive for cough and shortness of breath. Negative for wheezing.   Cardiovascular: Positive for chest pain.  Musculoskeletal: Negative for myalgias.  Skin: Negative for rash.  Allergic/Immunologic: Negative for environmental allergies.  Neurological: Negative for headaches.       Objective:   Physical Exam  Constitutional: Vital signs are normal. She appears well-developed and well-nourished. She is cooperative.  Non-toxic appearance. She does not appear ill. No distress.  HENT:  Head: Normocephalic.  Right Ear: Hearing, tympanic membrane, external ear and ear canal normal. Tympanic membrane is not erythematous, not retracted and not bulging.  Left Ear: Hearing, tympanic membrane, external ear and ear canal normal. Tympanic membrane is not erythematous, not retracted and not bulging.  Nose: Mucosal edema and rhinorrhea present. Right sinus exhibits no maxillary sinus tenderness and no frontal sinus tenderness. Left sinus exhibits no maxillary sinus tenderness and no frontal sinus tenderness.  Mouth/Throat:  Uvula is midline, oropharynx is clear and moist and mucous membranes are normal.  Eyes: Conjunctivae, EOM and lids are normal. Pupils are equal, round, and reactive to light. Lids are everted and swept, no foreign bodies found.  Neck: Trachea normal and normal range of motion. Neck supple. Carotid bruit is not present. No mass and no thyromegaly present.  Cardiovascular: Normal rate, regular rhythm, S1 normal, S2 normal, normal heart sounds, intact distal pulses and normal pulses.  Exam reveals no gallop and no friction rub.   No murmur heard. Pulmonary/Chest: Effort normal and breath sounds normal. Not tachypneic. No respiratory distress. She has no decreased breath sounds. She has no wheezes. She has no rhonchi. She has no rales.  Neurological: She is alert.  Skin: Skin is warm, dry and intact. No rash noted.  Psychiatric: Her speech is normal and behavior is normal. Judgment normal. Her mood appears not anxious. Cognition and memory are normal. She does not exhibit a depressed mood.          Assessment & Plan:

## 2013-10-24 NOTE — Patient Instructions (Signed)
Benzonatate during the day for dry cough. Prescription cough syrup at bedtime.  Rest, fluids.  Call if not improving in next 7-10 days. Call sooner if shortness of breath not improving. Go to ER for severe SOB.

## 2013-10-24 NOTE — Assessment & Plan Note (Signed)
Symptomatic care. Cough suppressant rx given.

## 2013-10-24 NOTE — Progress Notes (Signed)
Pre visit review using our clinic review tool, if applicable. No additional management support is needed unless otherwise documented below in the visit note. 

## 2013-10-29 ENCOUNTER — Other Ambulatory Visit: Payer: Self-pay | Admitting: Family Medicine

## 2013-11-26 ENCOUNTER — Encounter: Payer: Self-pay | Admitting: Family Medicine

## 2013-11-26 ENCOUNTER — Ambulatory Visit (INDEPENDENT_AMBULATORY_CARE_PROVIDER_SITE_OTHER)
Admission: RE | Admit: 2013-11-26 | Discharge: 2013-11-26 | Disposition: A | Discharge status: Home / Self Care | Payer: BC Managed Care – PPO | Source: Ambulatory Visit | Attending: Family Medicine | Admitting: Family Medicine

## 2013-11-26 ENCOUNTER — Ambulatory Visit (INDEPENDENT_AMBULATORY_CARE_PROVIDER_SITE_OTHER): Payer: BC Managed Care – PPO | Admitting: Family Medicine

## 2013-11-26 VITALS — BP 113/69 | HR 69 | Temp 98.3°F | Ht 69.0 in | Wt 212.5 lb

## 2013-11-26 DIAGNOSIS — S6990XA Unspecified injury of unspecified wrist, hand and finger(s), initial encounter: Secondary | ICD-10-CM

## 2013-11-26 DIAGNOSIS — S59909A Unspecified injury of unspecified elbow, initial encounter: Secondary | ICD-10-CM

## 2013-11-26 DIAGNOSIS — S6992XA Unspecified injury of left wrist, hand and finger(s), initial encounter: Secondary | ICD-10-CM

## 2013-11-26 DIAGNOSIS — S59919A Unspecified injury of unspecified forearm, initial encounter: Secondary | ICD-10-CM

## 2013-11-26 NOTE — Progress Notes (Signed)
Pre visit review using our clinic review tool, if applicable. No additional management support is needed unless otherwise documented below in the visit note. 

## 2013-11-26 NOTE — Progress Notes (Signed)
[place] [address] Phone: [phone] Fax: [phone]  Patient ID: Jessica Aguirre MRN: [id number], DOB: [birth year], 43 y.o. Date of Encounter: 11/26/2013  Primary Physician:  Eliezer Lofts, MD   Chief Complaint: Wrist Injury   Subjective:   History of Present Illness:  Jessica Aguirre is a 43 y.o. very pleasant female patient who presents with the following:  10/15/2013 fall.  Left hand:  The patient fell on her outstretched hand on the date above, and she is continued to have pain, primarily in her wrist. She does use her hands all the time when she is at work, and uses a Teaching laboratory technician. History is significant for Raynaud's disease and Sjogren's syndrome, but no other systemic rheumatological conditions.  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    Past Medical History, Surgical History, Social History, Family History, Problem List, Medications, and Allergies have been reviewed and updated if relevant.  Review of Systems:  GEN: No fevers, chills. Nontoxic. Primarily MSK c/o today. MSK: Detailed in the HPI GI: tolerating PO intake without difficulty Neuro: No numbness, parasthesias, or tingling associated. Otherwise the pertinent positives of the ROS are noted above.   Objective:   Physical Examination: BP 113/69  Pulse 69  Temp(Src) 98.3 F (36.8 C) (Oral)  Ht 5\' 9"  (1.753 m)  Wt 212 lb 8 oz (96.389 kg)  BMI 31.37 kg/m2  LMP 11/23/2013   GEN: WDWN, NAD, Non-toxic, Alert & Oriented x 3 HEENT: Atraumatic, Normocephalic.  Ears and Nose: No external deformity. EXTR: No clubbing/cyanosis/edema NEURO: Normal gait.  PSYCH: Normally interactive. Conversant. Not depressed or anxious appearing.  Calm demeanor.   Hand: L Ecchymosis or edema: neg ROM wrist/hand/digits/elbow: full  Carpals, MCP's, digits: NT Distal Ulna and Radius: NT Supination lift test: neg Ecchymosis or edema: neg Cysts/nodules: neg Finkelstein's test: neg Snuffbox  tenderness: mild TTP Scaphoid tubercle: Moderate TTP Hook of Hamate: NT Resisted supination: NT Full composite fist Grip, all digits: 5/5 str No tenosynovitis Axial load test: neg Phalen's: neg Tinel's: neg Atrophy: neg  Hand sensation: intact   Radiology: Dg Wrist Complete Left  11/27/2013   CLINICAL DATA:  Left wrist injury from fall  EXAM: LEFT WRIST - COMPLETE 3+ VIEW  COMPARISON:  None.  FINDINGS: The radiocarpal joint space appears normal. The ulnar styloid is intact. The carpal bones are in normal position and alignment is normal.  IMPRESSION: Negative.   Electronically Signed   By: Ivar Drape M.D.   On: 11/27/2013 08:22   Dg Hand Complete Left  11/27/2013   CLINICAL DATA:  Left wrist injury after fall.  EXAM: LEFT HAND - COMPLETE 3+ VIEW  COMPARISON:  None.  FINDINGS: There is no evidence of fracture or dislocation. There is no evidence of arthropathy or other focal bone abnormality. Soft tissues are unremarkable.  IMPRESSION: Normal left hand.   Electronically Signed   By: Sabino Dick M.D.   On: 11/27/2013 08:25    Assessment & Plan:   Wrist injury, left, initial encounter - Plan: DG Wrist Complete Left, DG Hand Complete Left  Significant pain in the patient's wrist, 5 weeks post injury. She is primarily having pain around in her scaphoid tubercle, and on that lateral aspect of her wrist in the carpal region. We discussed various options of treatment, and we are going to place her in a thumb spica splint and recheck her in 3 weeks. We discussed potential significant injury at this time and area of the  risk, and conservative management versus potential advanced imaging acutely, and the patient favored conservative approach with recheck in 3 weeks.  5 weeks post injury and negative for occult fracture.  New Prescriptions   No medications on file   Modified Medications   No medications on file   Orders Placed This Encounter  Procedures  . DG Wrist Complete Left  . DG  Hand Complete Left   Follow-up: Return in about 3 weeks (around 12/17/2013). Unless noted above, the patient is to follow-up if symptoms worsen. Red flags were reviewed with the patient.  Signed,  Maud Deed. Copland, MD, [facility]   Discontinued Medications   BENZONATATE (TESSALON) 200 MG CAPSULE    Take 1 capsule (200 mg total) by mouth 2 (two) times daily as needed for cough.   GUAIFENESIN-CODEINE (ROBITUSSIN AC) 100-10 MG/5ML SYRUP    Take 5-10 mLs by mouth at bedtime as needed for cough.   Current Medications at Discharge:   Medication List       This list is accurate as of: 11/26/13 11:59 PM.  Always use your most recent med list.               Calcium Citrate 200 MG Tabs  Take by mouth.     D 1000 1000 UNITS Chew  Generic drug:  Cholecalciferol  Chew by mouth.     ferrous sulfate 75 (15 FE) MG/ML Soln  Commonly known as:  FER-IN-SOL  Take 15 mg of iron by mouth daily.     FLUoxetine 20 MG tablet  Commonly known as:  PROZAC  TAKE 1 TABLET (20 MG TOTAL) BY MOUTH DAILY.     hydroxychloroquine 200 MG tablet  Commonly known as:  PLAQUENIL  Take by mouth 2 (two) times daily.     multivitamin-iron-minerals-folic acid chewable tablet  Chew 2 tablets by mouth daily.     NIFEdipine 30 MG 24 hr tablet  Commonly known as:  PROCARDIA-XL/ADALAT CC  Take 30 mg by mouth daily.

## 2013-12-17 ENCOUNTER — Ambulatory Visit: Payer: BC Managed Care – PPO | Admitting: Family Medicine

## 2014-04-11 ENCOUNTER — Other Ambulatory Visit: Payer: Self-pay | Admitting: Family Medicine

## 2014-06-01 ENCOUNTER — Encounter: Payer: Self-pay | Admitting: Family Medicine

## 2014-06-01 ENCOUNTER — Ambulatory Visit (INDEPENDENT_AMBULATORY_CARE_PROVIDER_SITE_OTHER): Payer: BC Managed Care – PPO | Admitting: Family Medicine

## 2014-06-01 VITALS — BP 110/74 | HR 82 | Temp 98.6°F | Wt 206.5 lb

## 2014-06-01 DIAGNOSIS — R05 Cough: Secondary | ICD-10-CM

## 2014-06-01 DIAGNOSIS — J040 Acute laryngitis: Secondary | ICD-10-CM

## 2014-06-01 DIAGNOSIS — M3500 Sicca syndrome, unspecified: Secondary | ICD-10-CM

## 2014-06-01 DIAGNOSIS — R059 Cough, unspecified: Secondary | ICD-10-CM

## 2014-06-01 MED ORDER — AZITHROMYCIN 250 MG PO TABS: ORAL_TABLET | ORAL | Status: DC

## 2014-06-01 MED ORDER — HYDROCODONE-HOMATROPINE 5-1.5 MG/5ML PO SYRP: ORAL_SOLUTION | ORAL | Status: DC

## 2014-06-01 NOTE — Progress Notes (Signed)
Pre visit review using our clinic review tool, if applicable. No additional management support is needed unless otherwise documented below in the visit note. 

## 2014-06-01 NOTE — Progress Notes (Signed)
Dr. Frederico Hamman T. Tanga Gloor, MD, [facility] [place], [address] Phone: [phone] Fax: [phone]  06/01/2014  Patient: Jessica Aguirre, MRN: [id number], DOB: 07/29/1970, 43 y.o.  Primary Physician:  Eliezer Lofts, MD  Chief Complaint: URI  Subjective:   Jessica Aguirre is a 43 y.o. very pleasant female patient who presents with the following:  Very pleasant 43 year old lady with a history of Sjogren's syndrome and Raynaud's disease on Plaquenil who presents with a four-day history of worsening cough that is productive of sputum, some intermittent sore throat. She also initially had lost her voice, but that has returned. She is having a difficult time sleeping, and she is coughing all night. Multiple over-the-counter cold medications have not helped at all.  Cough, started christmas eve. Some sore throat and that has eased off. Voice is off a lot. Lost voice yesterday.     Past Medical History, Surgical History, Social History, Family History, Problem List, Medications, and Allergies have been reviewed and updated if relevant.  ROS: GEN: Acute illness details above GI: Tolerating PO intake GU: maintaining adequate hydration and urination Pulm: No SOB Interactive and getting along well at home.  Otherwise, ROS is as per the HPI.   Objective:   BP 110/74 mmHg  Pulse 82  Temp(Src) 98.6 F (37 C) (Oral)  Wt 206 lb 8 oz (93.668 kg)  SpO2 98%  LMP 05/13/2014   GEN: A and O x 3. WDWN. NAD.    ENT: Nose clear, ext NML.  No LAD.  No JVD.  TM's clear. Oropharynx clear.  PULM: Normal WOB, no distress. No crackles, wheezes, rhonchi. CV: RRR, no M/G/R, No rubs, No JVD.   EXT: warm and well-perfused, No c/c/e. PSYCH: Pleasant and conversant.    Laboratory and Imaging Data:  Assessment and Plan:   Laryngitis acute, spasmodic  Cough  Sjogren's syndrome  Most likely viral in origin, even her laryngitis. I  gave her some Hycodan to use when necessary.  Given that she is on plaquenil, also gave her some Zithromax to hold onto in case she worsens with worsening shortness of breath or fever.  Follow-up: No Follow-up on file.  New Prescriptions   AZITHROMYCIN (ZITHROMAX) 250 MG TABLET    2 tabs po on day 1, then 1 tab po for 4 days   HYDROCODONE-HOMATROPINE (HYCODAN) 5-1.5 MG/5ML SYRUP    1 tsp po at night before bed prn cough   No orders of the defined types were placed in this encounter.    Signed,  Maud Deed. Theressa Piedra, MD   Patient's Medications  New Prescriptions   AZITHROMYCIN (ZITHROMAX) 250 MG TABLET    2 tabs po on day 1, then 1 tab po for 4 days   HYDROCODONE-HOMATROPINE (HYCODAN) 5-1.5 MG/5ML SYRUP    1 tsp po at night before bed prn cough  Previous Medications   CALCIUM CITRATE 200 MG TABS    Take by mouth.   CHOLECALCIFEROL (D 1000) 1000 UNITS CHEW    Chew by mouth.   FERROUS SULFATE (FER-IN-SOL) 75 (15 FE) MG/ML SOLN    Take 15 mg of iron by mouth daily.   FLUOXETINE (PROZAC) 20 MG TABLET    TAKE 1 TABLET BY MOUTH DAILY.   HYDROXYCHLOROQUINE (PLAQUENIL) 200 MG TABLET    Take by mouth 2 (two) times daily.   MULTIVITAMIN-IRON-MINERALS-FOLIC ACID (CENTRUM) CHEWABLE TABLET    Chew 2 tablets by mouth daily.   NIFEDIPINE (  PROCARDIA-XL/ADALAT CC) 30 MG 24 HR TABLET    Take 30 mg by mouth daily.  Modified Medications   No medications on file  Discontinued Medications   No medications on file

## 2014-06-06 ENCOUNTER — Other Ambulatory Visit: Payer: Self-pay | Admitting: Family Medicine

## 2014-06-09 ENCOUNTER — Other Ambulatory Visit: Payer: Self-pay | Admitting: Family Medicine

## 2014-06-09 ENCOUNTER — Encounter: Payer: Self-pay | Admitting: Family Medicine

## 2014-06-09 MED ORDER — BENZONATATE 200 MG PO CAPS: 200.0000 mg | ORAL_CAPSULE | Freq: Three times a day (TID) | ORAL | Status: DC | PRN

## 2014-06-12 ENCOUNTER — Ambulatory Visit (INDEPENDENT_AMBULATORY_CARE_PROVIDER_SITE_OTHER): Payer: BC Managed Care – PPO | Admitting: Family Medicine

## 2014-06-12 ENCOUNTER — Encounter: Payer: Self-pay | Admitting: Family Medicine

## 2014-06-12 ENCOUNTER — Ambulatory Visit (INDEPENDENT_AMBULATORY_CARE_PROVIDER_SITE_OTHER)
Admission: RE | Admit: 2014-06-12 | Discharge: 2014-06-12 | Disposition: A | Discharge status: Home / Self Care | Payer: BC Managed Care – PPO | Source: Ambulatory Visit | Attending: Family Medicine | Admitting: Family Medicine

## 2014-06-12 ENCOUNTER — Telehealth: Payer: Self-pay | Admitting: *Deleted

## 2014-06-12 VITALS — BP 90/66 | HR 85 | Temp 98.8°F | Ht 69.0 in | Wt 204.8 lb

## 2014-06-12 DIAGNOSIS — R05 Cough: Secondary | ICD-10-CM

## 2014-06-12 DIAGNOSIS — R059 Cough, unspecified: Secondary | ICD-10-CM

## 2014-06-12 MED ORDER — DOXYCYCLINE HYCLATE 100 MG PO CAPS: 100.0000 mg | ORAL_CAPSULE | Freq: Two times a day (BID) | ORAL | Status: DC

## 2014-06-12 MED ORDER — HYDROCODONE-HOMATROPINE 5-1.5 MG/5ML PO SYRP: ORAL_SOLUTION | ORAL | Status: DC

## 2014-06-12 NOTE — Telephone Encounter (Signed)
Great!

## 2014-06-12 NOTE — Progress Notes (Signed)
Pre visit review using our clinic review tool, if applicable. No additional management support is needed unless otherwise documented below in the visit note. 

## 2014-06-12 NOTE — Patient Instructions (Signed)
We will call you with X-ray results.   Can use tylenol for chest wall pain. Use hycodan cough syrup at night to help with sleep.  If severe shortness of breath. Go to ER.

## 2014-06-12 NOTE — Telephone Encounter (Signed)
-----   Message from Jinny Sanders, MD sent at 06/12/2014  4:08 PM EST ----- Notify pt that no sign of pneumonia. Given continued respiratory symptoms  Including shortness of breath, possible atypical pneumonia.Marland Kitchenwill treat with doxycycline 100 mg daily x 20 day #10, 0RF

## 2014-06-12 NOTE — Progress Notes (Signed)
   Subjective:    Patient ID: Jessica Aguirre, female    DOB: [birth year], 44 y.o.   MRN: [id number]  Cough This is a new problem. The current episode started 1 to 4 weeks ago. Progression since onset: improved a little but not back to baseline. The problem occurs every few minutes. The cough is non-productive. Associated symptoms include myalgias. Pertinent negatives include no chills, ear congestion, fever, headaches, nasal congestion, postnasal drip, rash, rhinorrhea, sore throat, shortness of breath or wheezing. Associated symptoms comments: Chest pain with breathing, inhale.  fatigue.  with house work cannot catch breath as well as usual.. The symptoms are aggravated by lying down (keeping her up at night). Risk factors: nonsmoker. Treatments tried: Zpack, robitussin, nyquil, theraflu. The treatment provided moderate relief. Her past medical history is significant for bronchitis. There is no history of asthma, bronchiectasis, COPD, emphysema, environmental allergies or pneumonia.   She is immunocomp on plaquenil, no steroids. Saw Dr. Lorelei Pont in December.. Viral bronchitis.  treated with Z-pack.. She is some better but not fully well.    Review of Systems  Constitutional: Negative for fever and chills.  HENT: Negative for postnasal drip, rhinorrhea and sore throat.   Respiratory: Positive for cough. Negative for shortness of breath and wheezing.   Musculoskeletal: Positive for myalgias.  Skin: Negative for rash.  Allergic/Immunologic: Negative for environmental allergies.  Neurological: Negative for headaches.       Objective:   Physical Exam  Constitutional: Vital signs are normal. She appears well-developed and well-nourished. She is cooperative.  Non-toxic appearance. She does not appear ill. No distress.  HENT:  Head: Normocephalic.  Right Ear: Hearing, tympanic membrane, external ear and ear canal normal. Tympanic membrane is not erythematous, not retracted and not bulging.    Left Ear: Hearing, tympanic membrane, external ear and ear canal normal. Tympanic membrane is not erythematous, not retracted and not bulging.  Nose: Mucosal edema and rhinorrhea present. Right sinus exhibits no maxillary sinus tenderness and no frontal sinus tenderness. Left sinus exhibits no maxillary sinus tenderness and no frontal sinus tenderness.  Mouth/Throat: Uvula is midline, oropharynx is clear and moist and mucous membranes are normal.  Eyes: Conjunctivae, EOM and lids are normal. Pupils are equal, round, and reactive to light. Lids are everted and swept, no foreign bodies found.  Neck: Trachea normal and normal range of motion. Neck supple. Carotid bruit is not present. No thyroid mass and no thyromegaly present.  Cardiovascular: Normal rate, regular rhythm, S1 normal, S2 normal, normal heart sounds, intact distal pulses and normal pulses.  Exam reveals no gallop and no friction rub.   No murmur heard. Pulmonary/Chest: Effort normal. No tachypnea. No respiratory distress. She has decreased breath sounds. She has no wheezes. She has no rhonchi. She has no rales. Chest wall is not dull to percussion. She exhibits tenderness. She exhibits no mass and no bony tenderness.  Neurological: She is alert.  Skin: Skin is warm, dry and intact. No rash noted.  Psychiatric: Her speech is normal and behavior is normal. Judgment normal. Her mood appears not anxious. Cognition and memory are normal. She does not exhibit a depressed mood.          Assessment & Plan:

## 2014-06-12 NOTE — Telephone Encounter (Signed)
Amariah notified as instructed by telephone.  Prescription for Doxycycline 100 mg sent to [facility].

## 2014-07-16 ENCOUNTER — Other Ambulatory Visit (HOSPITAL_COMMUNITY)
Admission: RE | Admit: 2014-07-16 | Discharge: 2014-07-16 | Disposition: A | Discharge status: Home / Self Care | Payer: BC Managed Care – PPO | Source: Ambulatory Visit | Attending: Family Medicine | Admitting: Family Medicine

## 2014-07-16 ENCOUNTER — Encounter: Payer: Self-pay | Admitting: Family Medicine

## 2014-07-16 ENCOUNTER — Ambulatory Visit (INDEPENDENT_AMBULATORY_CARE_PROVIDER_SITE_OTHER): Payer: BC Managed Care – PPO | Admitting: Family Medicine

## 2014-07-16 VITALS — BP 90/60 | HR 65 | Temp 97.6°F | Ht 69.0 in | Wt 204.5 lb

## 2014-07-16 DIAGNOSIS — Z0000 Encounter for general adult medical examination without abnormal findings: Secondary | ICD-10-CM

## 2014-07-16 DIAGNOSIS — Z01419 Encounter for gynecological examination (general) (routine) without abnormal findings: Secondary | ICD-10-CM | POA: Diagnosis present

## 2014-07-16 DIAGNOSIS — Z23 Encounter for immunization: Secondary | ICD-10-CM

## 2014-07-16 DIAGNOSIS — Z124 Encounter for screening for malignant neoplasm of cervix: Secondary | ICD-10-CM

## 2014-07-16 NOTE — Progress Notes (Signed)
Pre visit review using our clinic review tool, if applicable. No additional management support is needed unless otherwise documented below in the visit note. 

## 2014-07-16 NOTE — Patient Instructions (Addendum)
Call to schedule mammogram on your own. Keep up excellent work on healthy eating, regular exercise and weight loss.

## 2014-07-16 NOTE — Progress Notes (Signed)
HPI The patient is here for annual wellness exam and preventative care.    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 Wt Readings from Last 3 Encounters:  07/16/14 204 lb 8 oz (92.761 kg)  06/12/14 204 lb 12 oz (92.874 kg)  06/01/14 206 lb 8 oz (93.668 kg)   Exercise 6 times a week. Decreasing carbs, high protein diet and trying to eat fruit and veggies.  Rheumtologist also following low wbc ( 2.9,3.4) in last month has recheck in 2 weeks.   Reviewed labs from [facility] in care everywhere. CMET: nml, nml glucose, LFT stable  Vit D nml on supplement  B12 nml  Iron nml Chol : tot 220, HDl 82, LDl 138  Seasonal affective disorder: stable on prozac.  Diagnosed with Sjogren's syndrome. Now on plaquenil for this. On nifedipine for raynaud's syndrome.   Review of Systems  Constitutional: Negative for fever, fatigue and unexpected weight change.  HENT: Negative for ear pain, congestion, sore throat, sneezing, trouble swallowing and sinus pressure.  Eyes: Negative for pain and itching.  Respiratory: Negative for cough, shortness of breath and wheezing.  Cardiovascular: Negative for chest pain, palpitations and leg swelling.  Gastrointestinal: Negative for nausea, abdominal pain, diarrhea, constipation and blood in stool.  Genitourinary: Negative for dysuria, hematuria, vaginal discharge, difficulty urinating and menstrual problem.  Skin: Negative for rash.  Neurological: Negative for syncope, weakness, light-headedness, numbness and headaches.  Psychiatric/Behavioral: Negative for confusion and dysphoric mood. The patient is not nervous/anxious.       Objective:   Physical Exam  Constitutional: Vital signs are normal. She appears well-developed and well-nourished. She is cooperative. Non-toxic appearance. She does not appear ill. No distress.  Morbidly obese  HENT:  Head: Normocephalic.  Right Ear: Hearing, tympanic membrane, external ear and ear  canal normal.  Left Ear: Hearing, tympanic membrane, external ear and ear canal normal.  Nose: Nose normal.  Eyes: Conjunctivae, EOM and lids are normal. Pupils are equal, round, and reactive to light. No foreign bodies found.  Neck: Trachea normal and normal range of motion. Neck supple. Carotid bruit is not present. No mass and no thyromegaly present.  Cardiovascular: Normal rate, regular rhythm, S1 normal, S2 normal, normal heart sounds and intact distal pulses. Exam reveals no gallop.  No murmur heard. Pulmonary/Chest: Effort normal and breath sounds normal. No respiratory distress. She has no wheezes. She has no rhonchi. She has no rales.  Abdominal: Soft. Normal appearance and bowel sounds are normal. She exhibits no distension, no fluid wave, no abdominal bruit and no mass. There is no hepatosplenomegaly. There is no tenderness. There is no rebound, no guarding and no CVA tenderness. No hernia.  Genitourinary: Vagina normal and uterus normal. No breast swelling, tenderness, discharge or bleeding. Pelvic exam was performed with patient prone. There is no rash, tenderness or lesion on the right labia. There is no rash, tenderness or lesion on the left labia. Uterus is not enlarged and not tender. Cervix exhibits no motion tenderness, no discharge and no friability. Right adnexum displays no mass, no tenderness and no fullness. Left adnexum displays no mass, no tenderness and no fullness.  Lymphadenopathy:   She has no cervical adenopathy.   She has no axillary adenopathy.  Neurological: She is alert. She has normal strength. No cranial nerve deficit or sensory deficit.  Skin: Skin is warm, dry and intact. No rash noted.  Psychiatric: Her speech is normal and behavior is normal. Judgment normal. Her mood appears not  anxious. Cognition and memory are normal. She does not exhibit a depressed mood.          Assessment & Plan:  The patient's preventative maintenance and recommended  screening tests for an annual wellness exam were reviewed in full today. Brought up to date unless services declined.  Counselled on the importance of diet, exercise, and its role in overall health and mortality. The patient's FH and SH was reviewed, including their home life, tobacco status, and drug and alcohol status.   Due for pap/DVE: will do pap this year then go to 3 year schedule. year DVE Mammo: due Vaccines: Due for pneumonia vaccine given immunocomp, TDAP Nonsmoker Colon: no early colon cacner

## 2014-07-16 NOTE — Addendum Note (Signed)
Addended by: Carter Kitten on: 07/16/2014 10:19 AM   Modules accepted: Orders

## 2014-07-17 DIAGNOSIS — R053 Chronic cough: Secondary | ICD-10-CM | POA: Insufficient documentation

## 2014-07-17 DIAGNOSIS — R05 Cough: Secondary | ICD-10-CM | POA: Insufficient documentation

## 2014-07-17 DIAGNOSIS — R059 Cough, unspecified: Secondary | ICD-10-CM | POA: Insufficient documentation

## 2014-07-17 NOTE — Assessment & Plan Note (Signed)
Eval with CXR. Can use tylenol for chest wall pain. Use hycodan cough syrup at night to help with sleep.

## 2014-07-21 LAB — CYTOLOGY - PAP

## 2014-10-21 ENCOUNTER — Other Ambulatory Visit: Payer: Self-pay | Admitting: Family Medicine

## 2015-06-28 ENCOUNTER — Other Ambulatory Visit: Payer: Self-pay | Admitting: Family Medicine

## 2015-08-03 ENCOUNTER — Telehealth: Payer: Self-pay | Admitting: Family Medicine

## 2015-08-03 NOTE — Telephone Encounter (Signed)
Please call and schedule CPE with fasting labs prior for Dr. Bedsole.  

## 2015-08-03 NOTE — Telephone Encounter (Signed)
Labs 6/12 cpx 6/16 Pt aware  Please close

## 2015-09-30 ENCOUNTER — Other Ambulatory Visit: Payer: Self-pay | Admitting: Family Medicine

## 2015-11-15 ENCOUNTER — Telehealth: Payer: Self-pay | Admitting: Family Medicine

## 2015-11-15 ENCOUNTER — Other Ambulatory Visit: Payer: BC Managed Care – PPO

## 2015-11-15 DIAGNOSIS — E7800 Pure hypercholesterolemia, unspecified: Secondary | ICD-10-CM

## 2015-11-15 NOTE — Telephone Encounter (Signed)
-----   Message from Marchia Bond sent at 11/08/2015  1:53 PM EDT ----- Regarding: Cpx labs Mon 6/12, need orders. Thanks! :-) Please order  future cpx labs for pt's upcoming lab appt. Thanks Aniceto Boss

## 2015-11-19 ENCOUNTER — Encounter: Payer: BC Managed Care – PPO | Admitting: Family Medicine

## 2016-02-29 ENCOUNTER — Telehealth: Payer: Self-pay | Admitting: Family Medicine

## 2016-02-29 DIAGNOSIS — E7800 Pure hypercholesterolemia, unspecified: Secondary | ICD-10-CM

## 2016-02-29 NOTE — Telephone Encounter (Signed)
-----   Message from Ellamae Sia sent at 02/24/2016  3:15 PM EDT ----- Regarding: Lab orders for Wednesday, 9.27.17 Patient is scheduled for CPX labs, please order future labs, Thanks , Karna Christmas

## 2016-03-01 ENCOUNTER — Other Ambulatory Visit (INDEPENDENT_AMBULATORY_CARE_PROVIDER_SITE_OTHER): Payer: BC Managed Care – PPO

## 2016-03-01 ENCOUNTER — Other Ambulatory Visit: Payer: BC Managed Care – PPO

## 2016-03-01 DIAGNOSIS — E7800 Pure hypercholesterolemia, unspecified: Secondary | ICD-10-CM

## 2016-03-02 LAB — LIPID PANEL
CHOL/HDL RATIO: 3
CHOLESTEROL: 235 mg/dL — AB (ref 0–200)
HDL: 84.9 mg/dL (ref >39.00)
LDL CALC: 136 mg/dL — AB (ref 0–99)
NONHDL: 150.11
Triglycerides: 73 mg/dL (ref 0.0–149.0)
VLDL: 14.6 mg/dL (ref 0.0–40.0)

## 2016-03-02 LAB — COMPREHENSIVE METABOLIC PANEL
ALT: 19 U/L (ref 0–35)
AST: 15 U/L (ref 0–37)
Albumin: 4 g/dL (ref 3.5–5.2)
Alkaline Phosphatase: 55 U/L (ref 39–117)
BUN: 13 mg/dL (ref 6–23)
CHLORIDE: 103 meq/L (ref 96–112)
CO2: 29 meq/L (ref 19–32)
CREATININE: 0.7 mg/dL (ref 0.40–1.20)
Calcium: 8.8 mg/dL (ref 8.4–10.5)
GFR: 96.68 mL/min (ref >60.00)
GLUCOSE: 75 mg/dL (ref 70–99)
POTASSIUM: 4.1 meq/L (ref 3.5–5.1)
SODIUM: 137 meq/L (ref 135–145)
Total Bilirubin: 0.3 mg/dL (ref 0.2–1.2)
Total Protein: 7.1 g/dL (ref 6.0–8.3)

## 2016-03-06 ENCOUNTER — Ambulatory Visit (INDEPENDENT_AMBULATORY_CARE_PROVIDER_SITE_OTHER): Payer: BC Managed Care – PPO | Admitting: Internal Medicine

## 2016-03-06 ENCOUNTER — Encounter: Payer: Self-pay | Admitting: Internal Medicine

## 2016-03-06 VITALS — BP 100/70 | HR 78 | Temp 98.6°F | Wt 252.5 lb

## 2016-03-06 DIAGNOSIS — J029 Acute pharyngitis, unspecified: Secondary | ICD-10-CM | POA: Diagnosis not present

## 2016-03-06 DIAGNOSIS — B349 Viral infection, unspecified: Secondary | ICD-10-CM

## 2016-03-06 DIAGNOSIS — H9202 Otalgia, left ear: Secondary | ICD-10-CM

## 2016-03-06 MED ORDER — METHYLPREDNISOLONE ACETATE 80 MG/ML IJ SUSP
80.0000 mg | Freq: Once | INTRAMUSCULAR | Status: AC
Start: 2016-03-06 — End: 2016-03-06
  Administered 2016-03-06: 80 mg via INTRAMUSCULAR

## 2016-03-06 NOTE — Addendum Note (Signed)
Addended by: Lurlean Nanny on: 03/06/2016 04:48 PM   Modules accepted: Orders

## 2016-03-06 NOTE — Progress Notes (Signed)
Subjective:    Patient ID: Jessica Aguirre, female    DOB: 1971/04/26, 44 y.o.   MRN: [id number]  HPI  Pt presents to the clinic today with c/o left side ear pain and left side sore throat. She reports this started 3 days ago. She denies decreased hearing or difficulty swallowing. She denies runny nose, nasal congestion or cough. She denies fever, chills or body aches. She has tried Tylenol with some relief. She has no history of seasonal allergies. She has not had sick contacts.  Review of Systems      No past medical history on file.  Current Outpatient Prescriptions  Medication Sig Dispense Refill  . Ascorbic Acid (VITAMIN C) 1000 MG tablet Take 1,000 mg by mouth daily.    . Calcium Citrate 200 MG TABS Take by mouth.    . Cholecalciferol (D 1000) 1000 UNITS CHEW Chew by mouth.    . COD LIVER OIL PO Take 1 tablet by mouth daily.    . famotidine (PEPCID) 20 MG tablet Take 20 mg by mouth daily.    . ferrous sulfate (FER-IN-SOL) 75 (15 FE) MG/ML SOLN Take 15 mg of iron by mouth daily.    Marland Kitchen FLUoxetine (PROZAC) 20 MG tablet TAKE 1 TABLET (20 MG TOTAL) BY MOUTH DAILY. NEEDS OFFICE VISIT 30 tablet 1  . hydroxychloroquine (PLAQUENIL) 200 MG tablet Take by mouth 2 (two) times daily.    . multivitamin-iron-minerals-folic acid (CENTRUM) chewable tablet Chew 2 tablets by mouth daily.    Marland Kitchen NIFEdipine (PROCARDIA-XL/ADALAT CC) 30 MG 24 hr tablet Take 30 mg by mouth daily.     No current facility-administered medications for this visit.     No Known Allergies  Family History  Problem Relation Age of Onset  . Sudden death Father   . Obesity Father     patient was over 800 lbs  . Hypertension Father   . Diabetes Father   . Diabetes Maternal Grandmother   . Hypertension Maternal Grandmother   . Hypertension Maternal Grandfather   . Diabetes Maternal Grandfather   . Diabetes Paternal Grandmother   . Hypertension Paternal Grandmother     Social History   Social History  . Marital  status: Married    Spouse name: N/A  . Number of children: 3  . Years of education: N/A   Occupational History  . Not on file.   Social History Main Topics  . Smoking status: Never Smoker  . Smokeless tobacco: Never Used  . Alcohol use No  . Drug use: No  . Sexual activity: Not on file   Other Topics Concern  . 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  HEENT: Pt reports ear pain and sore throat. Denies eye pain, eye redness, ringing in the ears, wax buildup, runny nose, nasal congestion, bloody nose. Respiratory: Denies difficulty breathing, shortness of breath, cough or sputum production.    No other specific complaints in a complete review of systems (except as listed in HPI above).  Objective:   Physical Exam  BP 100/70   Pulse 78   Temp 98.6 F (37 C) (Oral)   Wt 252 lb 8 oz (114.5 kg)   SpO2 98%   BMI 37.29 kg/m   Wt Readings  from Last 3 Encounters:  03/06/16 252 lb 8 oz (114.5 kg)  07/16/14 204 lb 8 oz (92.8 kg)  06/12/14 204 lb 12 oz (92.9 kg)    General: Appears her stated age, well developed, well nourished in NAD. HEENT: Head: normal shape and size, no sinus tenderness noted; Eyes: sclera white, no icterus, conjunctiva pink; Ears: Tm's pink but intact, normal light reflex, + serous effusion bilaterally; Nose: mucosa pink and moist, septum midline; Throat/Mouth: Teeth present, mucosa pink and moist, no exudate, lesions or ulcerations noted.  Neck:  Cervical adenopathy noted on the right.  Cardiovascular: Normal rate and rhythm. S1,S2 noted.   Pulmonary/Chest: Normal effort and positive vesicular breath sounds. No respiratory distress. No wheezes, rales or ronchi noted.    BMET    Component Value Date/Time   NA 137 03/01/2016 1517     K 4.1 03/01/2016 1517   CL 103 03/01/2016 1517   CO2 29 03/01/2016 1517   GLUCOSE 75 03/01/2016 1517   BUN 13 03/01/2016 1517   CREATININE 0.70 03/01/2016 1517   CALCIUM 8.8 03/01/2016 1517   GFRNONAA 119.71 01/29/2009 1021    Lipid Panel     Component Value Date/Time   CHOL 235 (H) 03/01/2016 1517   TRIG 73.0 03/01/2016 1517   HDL 84.90 03/01/2016 1517   CHOLHDL 3 03/01/2016 1517   VLDL 14.6 03/01/2016 1517   LDLCALC 136 (H) 03/01/2016 1517    CBC    Component Value Date/Time   WBC 5.9 01/30/2013 1442   RBC 4.31 01/30/2013 1442   HGB 12.7 01/30/2013 1442   HCT 37.8 01/30/2013 1442   PLT 276.0 01/30/2013 1442   MCV 87.9 01/30/2013 1442   MCHC 33.7 01/30/2013 1442   RDW 14.0 01/30/2013 1442   LYMPHSABS 2.0 01/30/2013 1442   MONOABS 0.4 01/30/2013 1442   EOSABS 0.1 01/30/2013 1442   BASOSABS 0.0 01/30/2013 1442    Hgb A1C No results found for: HGBA1C      Assessment & Plan:   Viral illness:  80 mg Depo IM today Salt water gargles for sore throat Start Flonase and Allegra OTC Return precautions discussed  RTC as needed or if symptoms persist or worsen Ethel Veronica, NP

## 2016-03-06 NOTE — Patient Instructions (Signed)
Earache An earache, also called otalgia, can be caused by many things. Pain from an earache can be sharp, dull, or burning. The pain may be temporary or constant. Earaches can be caused by problems with the ear, such as infection in either the middle ear or the ear canal, injury, impacted ear wax, middle ear pressure, or a foreign body in the ear. Ear pain can also result from problems in other areas. This is called referred pain. For example, pain can come from a sore throat, a tooth infection, or problems with the jaw or the joint between the jaw and the skull (temporomandibular joint, or TMJ). The cause of an earache is not always easy to identify. Watchful waiting may be appropriate for some earaches until a clear cause of the pain can be found. HOME CARE INSTRUCTIONS Watch your condition for any changes. The following actions may help to lessen any discomfort that you are feeling:  Take medicines only as directed by your health care provider. This includes ear drops.  Apply ice to your outer ear to help reduce pain.  Put ice in a plastic bag.  Place a towel between your skin and the bag.  Leave the ice on for 20 minutes, 2-3 times per day.  Do not put anything in your ear other than medicine that is prescribed by your health care provider.  Try resting in an upright position instead of lying down. This may help to reduce pressure in the middle ear and relieve pain.  Chew gum if it helps to relieve your ear pain.  Control any allergies that you have.  Keep all follow-up visits as directed by your health care provider. This is important. SEEK MEDICAL CARE IF:  Your pain does not improve within 2 days.  You have a fever.  You have new or worsening symptoms. SEEK IMMEDIATE MEDICAL CARE IF:  You have a severe headache.  You have a stiff neck.  You have difficulty swallowing.  You have redness or swelling behind your ear.  You have drainage from your ear.  You have hearing  loss.  You feel dizzy.   This information is not intended to replace advice given to you by your health care provider. Make sure you discuss any questions you have with your health care provider.   Document Released: 01/07/2004 Document Revised: 06/12/2014 Document Reviewed: 12/21/2013 Elsevier Interactive Patient Education 2016 Elsevier Inc.  

## 2016-03-07 ENCOUNTER — Encounter: Payer: BC Managed Care – PPO | Admitting: Family Medicine

## 2016-03-23 ENCOUNTER — Encounter: Payer: Self-pay | Admitting: Internal Medicine

## 2016-03-23 ENCOUNTER — Ambulatory Visit (INDEPENDENT_AMBULATORY_CARE_PROVIDER_SITE_OTHER): Payer: BC Managed Care – PPO | Admitting: Internal Medicine

## 2016-03-23 VITALS — BP 104/74 | HR 77 | Temp 98.2°F

## 2016-03-23 DIAGNOSIS — H6982 Other specified disorders of Eustachian tube, left ear: Secondary | ICD-10-CM

## 2016-03-23 DIAGNOSIS — H9202 Otalgia, left ear: Secondary | ICD-10-CM | POA: Diagnosis not present

## 2016-03-23 LAB — POCT RAPID STREP A (OFFICE): Rapid Strep A Screen: NEGATIVE

## 2016-03-23 MED ORDER — PREDNISONE 10 MG PO TABS: ORAL_TABLET | ORAL | 0 refills | Status: DC

## 2016-03-23 NOTE — Progress Notes (Signed)
Subjective:    Patient ID: Jessica Aguirre, female    DOB: 19-Sep-1970, 45 y.o.   MRN: [id number]  HPI  Pt presents to the clinic today with c/o left ear pain. She was seen for the same 03/06/16, diagnosed with a viral illness. She was given 80 mg Depo IM today and asked to take Allegra and Flonase OTC. She reports she has not gotten any relief of her ear pain with the medications advised. She describes the pain as sharp and stabbing. She denies decreased hearing or discharge from the ear. She has tried using sweet oil without much relief.  Review of Systems      No past medical history on file.  Current Outpatient Prescriptions  Medication Sig Dispense Refill  . Ascorbic Acid (VITAMIN C) 1000 MG tablet Take 1,000 mg by mouth daily.    . Calcium Citrate 200 MG TABS Take by mouth.    . Cholecalciferol (D 1000) 1000 UNITS CHEW Chew by mouth.    . COD LIVER OIL PO Take 1 tablet by mouth daily.    . famotidine (PEPCID) 20 MG tablet Take 20 mg by mouth daily.    . ferrous sulfate (FER-IN-SOL) 75 (15 FE) MG/ML SOLN Take 15 mg of iron by mouth daily.    Marland Kitchen FLUoxetine (PROZAC) 20 MG tablet TAKE 1 TABLET (20 MG TOTAL) BY MOUTH DAILY. NEEDS OFFICE VISIT 30 tablet 1  . hydroxychloroquine (PLAQUENIL) 200 MG tablet Take by mouth 2 (two) times daily.    . multivitamin-iron-minerals-folic acid (CENTRUM) chewable tablet Chew 2 tablets by mouth daily.    Marland Kitchen NIFEdipine (PROCARDIA-XL/ADALAT CC) 30 MG 24 hr tablet Take 30 mg by mouth daily.     No current facility-administered medications for this visit.     No Known Allergies  Family History  Problem Relation Age of Onset  . Sudden death Father   . Obesity Father     patient was over 800 lbs  . Hypertension Father   . Diabetes Father   . Diabetes Maternal Grandmother   . Hypertension Maternal Grandmother   . Hypertension Maternal Grandfather   . Diabetes Maternal Grandfather   . Diabetes Paternal Grandmother   . Hypertension Paternal  Grandmother     Social History   Social History  . Marital status: Married    Spouse name: N/A  . Number of children: 3  . Years of education: N/A   Occupational History  . Not on file.   Social History Main Topics  . Smoking status: Never Smoker  . Smokeless tobacco: Never Used  . Alcohol use No  . Drug use: No  . Sexual activity: Not on file   Other Topics Concern  . 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  HEENT: Pt reports left ear pain. Denies eye pain, eye redness, ringing in the ears, wax buildup, runny nose, nasal congestion, bloody nose, or sore throat. Respiratory: Denies difficulty breathing, shortness of breath, cough or sputum production.   Neurological: Denies dizziness, difficulty with memory, difficulty with speech or problems with balance and coordination.    No other specific complaints in a complete review of systems (except as listed in HPI above).  Objective:   Physical Exam  BP 104/74  Pulse 77   Temp 98.2 F (36.8 C) (Oral)   SpO2 98%  Wt Readings from Last 3 Encounters:  03/06/16 252 lb 8 oz (114.5 kg)  07/16/14 204 lb 8 oz (92.8 kg)  06/12/14 204 lb 12 oz (92.9 kg)    General: Appears her stated age, obese in NAD. HEENT: Ears: Tm's gray and intact, normal light reflex; Ears: TM's pink but intact, normal light reflux, + serous effusion bilaterally Throat/Mouth: Teeth present, mucosa pink and moist, no exudate, lesions or ulcerations noted.  Neck:  No adenopathy noted.    BMET    Component Value Date/Time   NA 137 03/01/2016 1517   K 4.1 03/01/2016 1517   CL 103 03/01/2016 1517   CO2 29 03/01/2016 1517   GLUCOSE 75 03/01/2016 1517   BUN 13 03/01/2016 1517   CREATININE 0.70 03/01/2016 1517   CALCIUM 8.8  03/01/2016 1517   GFRNONAA 119.71 01/29/2009 1021    Lipid Panel     Component Value Date/Time   CHOL 235 (H) 03/01/2016 1517   TRIG 73.0 03/01/2016 1517   HDL 84.90 03/01/2016 1517   CHOLHDL 3 03/01/2016 1517   VLDL 14.6 03/01/2016 1517   LDLCALC 136 (H) 03/01/2016 1517    CBC    Component Value Date/Time   WBC 5.9 01/30/2013 1442   RBC 4.31 01/30/2013 1442   HGB 12.7 01/30/2013 1442   HCT 37.8 01/30/2013 1442   PLT 276.0 01/30/2013 1442   MCV 87.9 01/30/2013 1442   MCHC 33.7 01/30/2013 1442   RDW 14.0 01/30/2013 1442   LYMPHSABS 2.0 01/30/2013 1442   MONOABS 0.4 01/30/2013 1442   EOSABS 0.1 01/30/2013 1442   BASOSABS 0.0 01/30/2013 1442    Hgb A1C No results found for: HGBA1C       Assessment & Plan:   Otalgia, left ear, secondary to ETD, left:  She insists on RST: negative Continue Allegra eRx for Pred Taper x 6 days If no improvement, can refer to ENT   RTC as needed or if symptoms persist or worsen Maryjo Ragon, NP

## 2016-03-23 NOTE — Patient Instructions (Signed)
Earache An earache, also called otalgia, can be caused by many things. Pain from an earache can be sharp, dull, or burning. The pain may be temporary or constant. Earaches can be caused by problems with the ear, such as infection in either the middle ear or the ear canal, injury, impacted ear wax, middle ear pressure, or a foreign body in the ear. Ear pain can also result from problems in other areas. This is called referred pain. For example, pain can come from a sore throat, a tooth infection, or problems with the jaw or the joint between the jaw and the skull (temporomandibular joint, or TMJ). The cause of an earache is not always easy to identify. Watchful waiting may be appropriate for some earaches until a clear cause of the pain can be found. HOME CARE INSTRUCTIONS Watch your condition for any changes. The following actions may help to lessen any discomfort that you are feeling:  Take medicines only as directed by your health care provider. This includes ear drops.  Apply ice to your outer ear to help reduce pain.  Put ice in a plastic bag.  Place a towel between your skin and the bag.  Leave the ice on for 20 minutes, 2-3 times per day.  Do not put anything in your ear other than medicine that is prescribed by your health care provider.  Try resting in an upright position instead of lying down. This may help to reduce pressure in the middle ear and relieve pain.  Chew gum if it helps to relieve your ear pain.  Control any allergies that you have.  Keep all follow-up visits as directed by your health care provider. This is important. SEEK MEDICAL CARE IF:  Your pain does not improve within 2 days.  You have a fever.  You have new or worsening symptoms. SEEK IMMEDIATE MEDICAL CARE IF:  You have a severe headache.  You have a stiff neck.  You have difficulty swallowing.  You have redness or swelling behind your ear.  You have drainage from your ear.  You have hearing  loss.  You feel dizzy.   This information is not intended to replace advice given to you by your health care provider. Make sure you discuss any questions you have with your health care provider.   Document Released: 01/07/2004 Document Revised: 06/12/2014 Document Reviewed: 12/21/2013 Elsevier Interactive Patient Education 2016 Elsevier Inc.  

## 2016-03-24 ENCOUNTER — Encounter: Payer: Self-pay | Admitting: Internal Medicine

## 2016-03-24 NOTE — Telephone Encounter (Signed)
Pt left v/m; pt was seen 03/23/16; pt was to cb after 2 days if still hurting and pt does not want to go thru weekend without abx.[facility]. Pt contact # [phone].

## 2016-03-26 ENCOUNTER — Other Ambulatory Visit: Payer: Self-pay | Admitting: Family Medicine

## 2016-03-26 NOTE — Telephone Encounter (Signed)
Last office visit 03/23/16 with Baity.  Last CPE 07/16/2014.  CPE scheduled 03/28/16.  Last refilled 09/30/15 for #30 with 1 refill?

## 2016-03-27 NOTE — Addendum Note (Signed)
Addended by: Lurlean Nanny on: 03/27/2016 09:07 AM   Modules accepted: Orders

## 2016-03-28 ENCOUNTER — Encounter: Payer: Self-pay | Admitting: Family Medicine

## 2016-03-28 ENCOUNTER — Ambulatory Visit (INDEPENDENT_AMBULATORY_CARE_PROVIDER_SITE_OTHER): Payer: BC Managed Care – PPO | Admitting: Family Medicine

## 2016-03-28 VITALS — BP 100/70 | HR 75 | Temp 98.6°F | Ht 69.0 in | Wt 253.8 lb

## 2016-03-28 DIAGNOSIS — H9202 Otalgia, left ear: Secondary | ICD-10-CM

## 2016-03-28 DIAGNOSIS — E7800 Pure hypercholesterolemia, unspecified: Secondary | ICD-10-CM

## 2016-03-28 DIAGNOSIS — M3500 Sicca syndrome, unspecified: Secondary | ICD-10-CM

## 2016-03-28 DIAGNOSIS — Z0000 Encounter for general adult medical examination without abnormal findings: Secondary | ICD-10-CM | POA: Diagnosis not present

## 2016-03-28 DIAGNOSIS — R5382 Chronic fatigue, unspecified: Secondary | ICD-10-CM | POA: Diagnosis not present

## 2016-03-28 MED ORDER — AMOXICILLIN 500 MG PO CAPS: 1000.0000 mg | ORAL_CAPSULE | Freq: Two times a day (BID) | ORAL | 0 refills | Status: DC

## 2016-03-28 MED ORDER — TRAMADOL HCL 50 MG PO TABS: 50.0000 mg | ORAL_TABLET | Freq: Three times a day (TID) | ORAL | 0 refills | Status: DC | PRN

## 2016-03-28 NOTE — Progress Notes (Signed)
Pre visit review using our clinic review tool, if applicable. No additional management support is needed unless otherwise documented below in the visit note. 

## 2016-03-28 NOTE — Progress Notes (Signed)
HPI The patient is here for annual wellness exam and preventative care.   Left ear pain x ongoing x 1 month No fever, no congestion, no allergy symptoms, left neck sore, sore when swallows. No ear discharge, no decreased hearing Seen on 10/2 felt viral infection.  No improvement with depo medrol 80 mg daily. Started flonase and Human resources officer... Npo help.  Returned on 10/19.. Worsened to stabbing pain  treated with pred taper: no help with symptoms. Cannot sleep at night with pain.  Teeth looked fine at dentist.   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 Wt Readings from Last 3 Encounters:  03/28/16 253 lb 12 oz (115.1 kg)  03/06/16 252 lb 8 oz (114.5 kg)  07/16/14 204 lb 8 oz (92.8 kg)   Exercise 0 Poor diet given job change.   Lab Results  Component Value Date   CHOL 235 (H) 03/01/2016   HDL 84.90 03/01/2016   LDLCALC 136 (H) 03/01/2016   LDLDIRECT 142.3 01/09/2013   TRIG 73.0 03/01/2016   CHOLHDL 3 03/01/2016   Seasonal affective disorder: stable on prozac.  Diagnosed with Sjogren's syndrome. Now on plaquenil for this. On nifedipine for raynaud's syndrome.   Has heavy menses.. Ongoing x 4-5 months. Goes through 5 heavy tampons a day for main 4 day of menses. Takes iron daily.   Social History /Family History/Past Medical History reviewed and updated if needed. No family history of thyroid issues.  Review of Systems  Constitutional: Negative for fever, fatigue and unexpected weight change.  She feels very tired all the time. Normally sleeping well. HENT: Negative for ear pain, congestion, sore throat, sneezing, trouble swallowing and sinus pressure.  Eyes: Negative for pain and itching.  Respiratory: Negative for cough, shortness of breath and wheezing.  Cardiovascular: Negative for chest pain, palpitations and leg swelling.  Gastrointestinal: Negative for nausea, abdominal pain, diarrhea, constipation and blood in stool.  Genitourinary:  Negative for dysuria, hematuria, vaginal discharge, difficulty urinating and menstrual problem.  Skin: Negative for rash.  Neurological: Negative for syncope, weakness, light-headedness, numbness and headaches.  Psychiatric/Behavioral: Negative for confusion and dysphoric mood. The patient is not nervous/anxious.       Objective:   Physical Exam  Constitutional: Vital signs are normal. She appears well-developed and well-nourished. She is cooperative. Non-toxic appearance. She does not appear ill. No distress.  Morbidly obese  HENT:  Head: Normocephalic.  Right Ear: Hearing, tympanic membrane, external ear and ear canal normal.  Left Ear: Hearing, tympanic membrane, external ear and ear canal normal.  Nose: Nose normal.  Eyes: Conjunctivae, EOM and lids are normal. Pupils are equal, round, and reactive to light. No foreign bodies found.  Neck: Trachea normal and normal range of motion. Neck supple. Carotid bruit is not present. No mass and no thyromegaly present.  Cardiovascular: Normal rate, regular rhythm, S1 normal, S2 normal, normal heart sounds and intact distal pulses. Exam reveals no gallop.  No murmur heard. Pulmonary/Chest: Effort normal and breath sounds normal. No respiratory distress. She has no wheezes. She has no rhonchi. She has no rales.  Abdominal: Soft. Normal appearance and bowel sounds are normal. She exhibits no distension, no fluid wave, no abdominal bruit and no mass. There is no hepatosplenomegaly. There is no tenderness. There is no rebound, no guarding and no CVA tenderness. No hernia.  Genitourinary: Vagina normal and uterus normal. No breast swelling, tenderness, discharge or bleeding. Pelvic exam was performed with patient prone. There is no rash, tenderness or lesion  on the right labia. There is no rash, tenderness or lesion on the left labia. Uterus is not enlarged and not tender. Cervix exhibits no motion tenderness, no discharge and no friability.  Right adnexum displays no mass, no tenderness and no fullness. Left adnexum displays no mass, no tenderness and no fullness.  Lymphadenopathy:   She has no cervical adenopathy.   She has no axillary adenopathy.  Neurological: She is alert. She has normal strength. No cranial nerve deficit or sensory deficit.  Skin: Skin is warm, dry and intact. No rash noted.  Psychiatric: Her speech is normal and behavior is normal. Judgment normal. Her mood appears not anxious. Cognition and memory are normal. She does not exhibit a depressed mood.       Assessment & Plan:  The patient's preventative maintenance and recommended screening tests for an annual wellness exam were reviewed in full today. Brought up to date unless services declined.  Counselled on the importance of diet, exercise, and its role in overall health and mortality. The patient's FH and SH was reviewed, including their home life, tobacco status, and drug and alcohol status.   Due for pap/DVE: Neg 2016 then go to 3 year schedule. Year DVE Mammo:  plan every 2 years. Low risk but hesitant about wanting until 51. Vaccines: Uptodate with pneumonia vaccine given immunocomp, TDAP, flu uptodate Nonsmoker Colon: no early colon cancer  No desire for STD testing.

## 2016-03-28 NOTE — Assessment & Plan Note (Signed)
Referral to ENT for further treatment.

## 2016-03-28 NOTE — Assessment & Plan Note (Signed)
Low chol diet. Encouraged exercise, weight loss, healthy eating habits.

## 2016-03-28 NOTE — Patient Instructions (Addendum)
Work on regular exercise as able.  Work on low cholesterol diet.  Stop at lab on way out. Stop at front desk for ENT referral. Call to schedule on own. Use tramadol for pain at night. Start antibiotics.

## 2016-03-28 NOTE — Assessment & Plan Note (Addendum)
Counseled on weight loss and risk of CV issues, DM etc associated.

## 2016-03-28 NOTE — Assessment & Plan Note (Addendum)
Stable on plaquenil. Followed by rheum.

## 2016-03-29 LAB — CBC WITH DIFFERENTIAL/PLATELET
BASOS ABS: 0 10*3/uL (ref 0.0–0.1)
Basophils Relative: 0.1 % (ref 0.0–3.0)
EOS ABS: 0 10*3/uL (ref 0.0–0.7)
Eosinophils Relative: 0.1 % (ref 0.0–5.0)
HEMATOCRIT: 35.8 % — AB (ref 36.0–46.0)
HEMOGLOBIN: 12.1 g/dL (ref 12.0–15.0)
LYMPHS PCT: 21.9 % (ref 12.0–46.0)
Lymphs Abs: 1.5 10*3/uL (ref 0.7–4.0)
MCHC: 33.8 g/dL (ref 30.0–36.0)
MCV: 88.5 fl (ref 78.0–100.0)
Monocytes Absolute: 0.4 10*3/uL (ref 0.1–1.0)
Monocytes Relative: 5.5 % (ref 3.0–12.0)
Neutro Abs: 4.8 10*3/uL (ref 1.4–7.7)
Neutrophils Relative %: 72.4 % (ref 43.0–77.0)
Platelets: 263 10*3/uL (ref 150.0–400.0)
RBC: 4.04 Mil/uL (ref 3.87–5.11)
RDW: 14.4 % (ref 11.5–15.5)
WBC: 6.7 10*3/uL (ref 4.0–10.5)

## 2016-03-29 LAB — VITAMIN D 25 HYDROXY (VIT D DEFICIENCY, FRACTURES): VITD: 29.82 ng/mL — ABNORMAL LOW (ref 30.00–100.00)

## 2016-03-29 LAB — VITAMIN B12: Vitamin B-12: 1407 pg/mL — ABNORMAL HIGH (ref 211–911)

## 2016-03-29 LAB — T4, FREE: FREE T4: 0.78 ng/dL (ref 0.60–1.60)

## 2016-03-29 LAB — T3, FREE: T3 FREE: 3.3 pg/mL (ref 2.3–4.2)

## 2016-03-29 LAB — TSH: TSH: 0.58 u[IU]/mL (ref 0.35–4.50)

## 2016-03-30 ENCOUNTER — Encounter: Payer: Self-pay | Admitting: Family Medicine

## 2016-04-24 ENCOUNTER — Other Ambulatory Visit: Payer: Self-pay | Admitting: *Deleted

## 2016-04-24 MED ORDER — FLUOXETINE HCL 20 MG PO TABS: ORAL_TABLET | ORAL | 1 refills | Status: DC

## 2017-02-20 ENCOUNTER — Other Ambulatory Visit: Payer: Self-pay | Admitting: Family Medicine

## 2017-02-20 DIAGNOSIS — Z1231 Encounter for screening mammogram for malignant neoplasm of breast: Secondary | ICD-10-CM

## 2017-02-28 ENCOUNTER — Ambulatory Visit
Admission: RE | Admit: 2017-02-28 | Discharge: 2017-02-28 | Disposition: A | Discharge status: Home / Self Care | Payer: BC Managed Care – PPO | Source: Ambulatory Visit | Attending: Family Medicine | Admitting: Family Medicine

## 2017-02-28 ENCOUNTER — Encounter: Payer: Self-pay | Admitting: *Deleted

## 2017-02-28 DIAGNOSIS — Z1231 Encounter for screening mammogram for malignant neoplasm of breast: Secondary | ICD-10-CM

## 2017-03-29 ENCOUNTER — Other Ambulatory Visit (INDEPENDENT_AMBULATORY_CARE_PROVIDER_SITE_OTHER): Payer: BC Managed Care – PPO

## 2017-03-29 ENCOUNTER — Telehealth: Payer: Self-pay | Admitting: Family Medicine

## 2017-03-29 DIAGNOSIS — E7800 Pure hypercholesterolemia, unspecified: Secondary | ICD-10-CM | POA: Diagnosis not present

## 2017-03-29 LAB — LIPID PANEL
CHOL/HDL RATIO: 3
Cholesterol: 240 mg/dL — ABNORMAL HIGH (ref 0–200)
HDL: 85.1 mg/dL (ref >39.00)
LDL Cholesterol: 140 mg/dL — ABNORMAL HIGH (ref 0–99)
NONHDL: 155.27
Triglycerides: 78 mg/dL (ref 0.0–149.0)
VLDL: 15.6 mg/dL (ref 0.0–40.0)

## 2017-03-29 LAB — COMPREHENSIVE METABOLIC PANEL
ALK PHOS: 50 U/L (ref 39–117)
ALT: 21 U/L (ref 0–35)
AST: 19 U/L (ref 0–37)
Albumin: 4.3 g/dL (ref 3.5–5.2)
BILIRUBIN TOTAL: 0.5 mg/dL (ref 0.2–1.2)
BUN: 10 mg/dL (ref 6–23)
CO2: 32 meq/L (ref 19–32)
CREATININE: 0.62 mg/dL (ref 0.40–1.20)
Calcium: 9.4 mg/dL (ref 8.4–10.5)
Chloride: 101 mEq/L (ref 96–112)
GFR: 110.67 mL/min (ref >60.00)
GLUCOSE: 81 mg/dL (ref 70–99)
Potassium: 3.9 mEq/L (ref 3.5–5.1)
Sodium: 139 mEq/L (ref 135–145)
TOTAL PROTEIN: 7.5 g/dL (ref 6.0–8.3)

## 2017-03-29 NOTE — Telephone Encounter (Signed)
Labs entered.

## 2017-04-03 ENCOUNTER — Ambulatory Visit (INDEPENDENT_AMBULATORY_CARE_PROVIDER_SITE_OTHER): Payer: BC Managed Care – PPO | Admitting: Family Medicine

## 2017-04-03 ENCOUNTER — Encounter: Payer: Self-pay | Admitting: Family Medicine

## 2017-04-03 VITALS — BP 90/60 | HR 98 | Temp 98.5°F | Ht 68.75 in | Wt 228.0 lb

## 2017-04-03 DIAGNOSIS — Z0000 Encounter for general adult medical examination without abnormal findings: Secondary | ICD-10-CM

## 2017-04-04 NOTE — Progress Notes (Signed)
Pt rescheduled

## 2017-04-05 ENCOUNTER — Other Ambulatory Visit: Payer: Self-pay | Admitting: Family Medicine

## 2017-05-02 ENCOUNTER — Encounter: Payer: Self-pay | Admitting: Family Medicine

## 2017-05-03 MED ORDER — NIFEDIPINE ER 30 MG PO TB24: 30.0000 mg | ORAL_TABLET | Freq: Every day | ORAL | 3 refills | Status: DC

## 2017-05-03 NOTE — Telephone Encounter (Signed)
Ok to fill 

## 2017-05-22 ENCOUNTER — Ambulatory Visit (INDEPENDENT_AMBULATORY_CARE_PROVIDER_SITE_OTHER): Payer: BC Managed Care – PPO | Admitting: Family Medicine

## 2017-05-22 ENCOUNTER — Other Ambulatory Visit: Payer: Self-pay

## 2017-05-22 ENCOUNTER — Encounter: Payer: Self-pay | Admitting: Family Medicine

## 2017-05-22 VITALS — BP 100/78 | HR 71 | Temp 97.8°F | Ht 68.75 in | Wt 231.5 lb

## 2017-05-22 DIAGNOSIS — M3500 Sicca syndrome, unspecified: Secondary | ICD-10-CM | POA: Diagnosis not present

## 2017-05-22 DIAGNOSIS — E669 Obesity, unspecified: Secondary | ICD-10-CM | POA: Diagnosis not present

## 2017-05-22 DIAGNOSIS — E7800 Pure hypercholesterolemia, unspecified: Secondary | ICD-10-CM | POA: Diagnosis not present

## 2017-05-22 DIAGNOSIS — Z0000 Encounter for general adult medical examination without abnormal findings: Secondary | ICD-10-CM | POA: Diagnosis not present

## 2017-05-22 DIAGNOSIS — I7300 Raynaud's syndrome without gangrene: Secondary | ICD-10-CM

## 2017-05-22 NOTE — Progress Notes (Signed)
Subjective:    Patient ID: Jessica Aguirre, female    DOB: 1971-02-11, 46 y.o.   MRN: [id number]  HPI The patient is here for annual wellness exam and preventative care.     20 more lbs weight loss in last year. Body mass index is 34.44 kg/m.  Wt Readings from Last 3 Encounters:  05/22/17 231 lb 8 oz (105 kg)  04/03/17 228 lb (103.4 kg)  03/28/16 253 lb 12 oz (115.1 kg)   S/P gastric sleeve in 2014   Elevated Cholesterol:  Inadequate control on  Lab Results  Component Value Date   CHOL 240 (H) 03/29/2017   HDL 85.10 03/29/2017   LDLCALC 140 (H) 03/29/2017   LDLDIRECT 142.3 01/09/2013   TRIG 78.0 03/29/2017   CHOLHDL 3 03/29/2017  Using medications without problems: Muscle aches:  Diet compliance: healthy eating Exercise: Doing boot camp.Marland Kitchen [employer] classes Other complaints:  Keeping up with vit D.   Sjogeren's: Followed by [facility] rheumatology  Blood pressure 100/78, pulse 71, temperature 97.8 F (36.6 C), temperature source Oral, height 5' 8.75" (1.746 m), weight 231 lb 8 oz (105 kg).   Social History /Family History/Past Medical History reviewed in detail and updated in EMR if needed.  Review of Systems  Constitutional: Negative for fatigue and fever.  HENT: Negative for congestion.   Eyes: Negative for pain.  Respiratory: Negative for cough and shortness of breath.   Cardiovascular: Negative for chest pain, palpitations and leg swelling.  Gastrointestinal: Negative for abdominal pain.  Genitourinary: Negative for dysuria and vaginal bleeding.  Musculoskeletal: Negative for back pain.  Neurological: Negative for syncope, light-headedness and headaches.  Psychiatric/Behavioral: Negative for dysphoric mood.       Objective:   Physical Exam  Constitutional: Vital signs are normal. She appears well-developed and well-nourished. She is cooperative.  Non-toxic appearance. She does not appear ill. No distress.  obesity  HENT:  Head: Normocephalic.  Right Ear:  Hearing, tympanic membrane, external ear and ear canal normal.  Left Ear: Hearing, tympanic membrane, external ear and ear canal normal.  Nose: Nose normal.  Eyes: Conjunctivae, EOM and lids are normal. Pupils are equal, round, and reactive to light. Lids are everted and swept, no foreign bodies found.  Neck: Trachea normal and normal range of motion. Neck supple. Carotid bruit is not present. No thyroid mass and no thyromegaly present.  Cardiovascular: Normal rate, regular rhythm, S1 normal, S2 normal, normal heart sounds and intact distal pulses. Exam reveals no gallop.  No murmur heard. Pulmonary/Chest: Effort normal and breath sounds normal. No respiratory distress. She has no wheezes. She has no rhonchi. She has no rales.  Abdominal: Soft. Normal appearance and bowel sounds are normal. She exhibits no distension, no fluid wave, no abdominal bruit and no mass. There is no hepatosplenomegaly. There is no tenderness. There is no rebound, no guarding and no CVA tenderness. No hernia.  Lymphadenopathy:    She has no cervical adenopathy.    She has no axillary adenopathy.  Neurological: She is alert. She has normal strength. No cranial nerve deficit or sensory deficit.  Skin: Skin is warm, dry and intact. No rash noted.  Psychiatric: Her speech is normal and behavior is normal. Judgment normal. Her mood appears not anxious. Cognition and memory are normal. She does not exhibit a depressed mood.          Assessment & Plan:  The patient's preventative maintenance and recommended screening tests for an annual wellness exam were reviewed in  full today. Brought up to date unless services declined.  Counselled on the importance of diet, exercise, and its role in overall health and mortality. The patient's FH and SH was reviewed, including their home life, tobacco status, and drug and alcohol status.    pap/DVE: Neg 2016 then go to 3 year schedule.DVE, not indicated. Mammo:  plan every 2 years.  02/2017 nml Vaccines: Uptodate with pneumonia vaccine given immunocomp, TDAP, flu uptodate Nonsmoker Colon: no early colon cancer.  No desire for STD testing.

## 2017-05-22 NOTE — Assessment & Plan Note (Signed)
- 

## 2017-05-22 NOTE — Assessment & Plan Note (Signed)
Pt continues to loose weight. Encouraged exercise, weight loss, healthy eating habits.

## 2017-05-22 NOTE — Assessment & Plan Note (Signed)
AHA 10 year risk 0.3% Encouraged exercise, weight loss, healthy eating habits. Low chol diet .Marland Kitchen Can consider red yeast rice.

## 2017-05-22 NOTE — Assessment & Plan Note (Signed)
Followed by [facility] rheum.. On plaquenil.  keeping up with opthalmology visits.  Considering starting Salagen for dry motuh per dentist.. No contraindications seen.

## 2017-05-22 NOTE — Patient Instructions (Addendum)
Can try red yeast rice 600,  2 tabs twice daily.  Keep up great work on healthy eating and regular exercise!

## 2018-02-11 NOTE — Progress Notes (Deleted)
Last PAP: 07/16/14 -Normal Next PAP: Due now Influenza vaccine Hep C screening HIV screening

## 2018-02-12 ENCOUNTER — Encounter: Payer: BC Managed Care – PPO | Admitting: Obstetrics and Gynecology

## 2018-02-26 NOTE — Progress Notes (Signed)
Last pap- 07/2014- normal STD?  Mammogram 03/02/2017 Influenza vaccine?

## 2018-02-27 ENCOUNTER — Encounter: Payer: Self-pay | Admitting: Family Medicine

## 2018-02-27 ENCOUNTER — Ambulatory Visit (INDEPENDENT_AMBULATORY_CARE_PROVIDER_SITE_OTHER): Payer: BC Managed Care – PPO | Admitting: Family Medicine

## 2018-02-27 VITALS — BP 112/75 | HR 81 | Ht 69.0 in | Wt 244.0 lb

## 2018-02-27 DIAGNOSIS — R102 Pelvic and perineal pain: Secondary | ICD-10-CM

## 2018-02-27 DIAGNOSIS — Z1151 Encounter for screening for human papillomavirus (HPV): Secondary | ICD-10-CM

## 2018-02-27 DIAGNOSIS — Z124 Encounter for screening for malignant neoplasm of cervix: Secondary | ICD-10-CM | POA: Diagnosis not present

## 2018-02-27 DIAGNOSIS — Z01419 Encounter for gynecological examination (general) (routine) without abnormal findings: Secondary | ICD-10-CM | POA: Diagnosis not present

## 2018-02-27 MED ORDER — ELAGOLIX SODIUM 150 MG PO TABS
150.0000 mg | ORAL_TABLET | Freq: Every day | ORAL | 11 refills | Status: DC
Start: 2018-02-27 — End: 2019-08-07

## 2018-02-27 NOTE — Progress Notes (Signed)
   GYNECOLOGY Problem ENCOUNTER NOTE with [facility]  Subjective:   Jessica Aguirre is a 47 y.o.female here for a routine annual gynecologic exam.  Current complaints: pelvic pain.   Denies abnormal vaginal bleeding, discharge, problems with intercourse or other gynecologic concerns.    Pelvic Pain:  Starting this past year she reports 5-6 days of excruciating pain preceding her period associated with a burning sensation. Reports heavy and very painful periods for "years". Denies pain with sex. Reports some ovarian pain associated with her cycle.   Gynecologic History Patient's last menstrual period was 02/15/2018. Contraception: tubal ligation Last Pap: 07/2014. Results were: normal Last mammogram: 02/2017. Results were: normal  The following portions of the patient's history were reviewed and updated as appropriate: allergies, current medications, past family history, past medical history, past social history, past surgical history and problem list.  Review of Systems Pertinent items are noted in HPI.   Objective:  BP 112/75   Pulse 81   Ht 5\' 9"  (1.753 m)   Wt 244 lb (110.7 kg)   LMP 02/15/2018   BMI 36.03 kg/m  CONSTITUTIONAL: Well-developed, well-nourished female in no acute distress.  HENT:  Normocephalic, atraumatic, External right and left ear normal. Oropharynx is clear and moist EYES:  No scleral icterus.  NECK: Normal range of motion, supple, no masses.  Normal thyroid.  SKIN: Skin is warm and dry. No rash noted. Not diaphoretic. No erythema. No pallor. NEUROLOGIC: Alert and oriented to person, place, and time. Normal reflexes, muscle tone coordination. No cranial nerve deficit noted. PSYCHIATRIC: Normal mood and affect. Normal behavior. Normal judgment and thought content. CARDIOVASCULAR: Normal heart rate noted, regular rhythm. 2+ distal pulses. RESPIRATORY: Effort and breath sounds normal, no problems with respiration noted. BREASTS: Symmetric in size. No  masses, skin changes, nipple drainage, or lymphadenopathy. ABDOMEN: Soft,  no distention noted.  No tenderness, rebound or guarding.  PELVIC: Normal appearing external genitalia; normal appearing vaginal mucosa and cervix.  No abnormal discharge noted.  Pap smear obtained.  Normal uterine size, no other palpable masses, no uterine or adnexal tenderness. No cul-du-sac nodularity.  MUSCULOSKELETAL: Normal range of motion.    Assessment and Plan:  1) Annual gynecologic examination with pap smear:  Will follow up results of pap smear and manage accordingly. Declines STI screen also ordered today.  Routine preventative health maintenance measures emphasized. Discussed having yearly physical with PCP for lipids and DM screening  2) Contraception counseling: Has BTS in the past.   3) Obesity- counseled on weight reduction.   4) Pelvic Pain (09628): suspect endometriosis. Discussed possibility of trial of [facility] receptor antagonist.  Patient counseled on side effects. No history of hepatic impairment, last LFTs wnl past year.   Please refer to After Visit Summary for other counseling recommendations.   Return in about 3 months (around 05/29/2018) for follow up pelvic pain.  Caren Macadam, MD, MPH, ABFM Attending [place] for [facility]

## 2018-02-28 ENCOUNTER — Other Ambulatory Visit: Payer: Self-pay | Admitting: Family Medicine

## 2018-02-28 DIAGNOSIS — Z1231 Encounter for screening mammogram for malignant neoplasm of breast: Secondary | ICD-10-CM

## 2018-03-01 LAB — CYTOLOGY - PAP
Diagnosis: NEGATIVE
HPV: NOT DETECTED

## 2018-03-05 ENCOUNTER — Other Ambulatory Visit: Payer: Self-pay | Admitting: Family Medicine

## 2018-03-15 ENCOUNTER — Encounter: Payer: Self-pay | Admitting: Family Medicine

## 2018-03-15 ENCOUNTER — Ambulatory Visit: Payer: BC Managed Care – PPO | Admitting: Family Medicine

## 2018-03-15 DIAGNOSIS — J329 Chronic sinusitis, unspecified: Secondary | ICD-10-CM | POA: Insufficient documentation

## 2018-03-15 DIAGNOSIS — J0100 Acute maxillary sinusitis, unspecified: Secondary | ICD-10-CM | POA: Diagnosis not present

## 2018-03-15 MED ORDER — PREDNISONE 20 MG PO TABS: ORAL_TABLET | ORAL | 0 refills | Status: DC

## 2018-03-15 MED ORDER — AMOXICILLIN 500 MG PO CAPS: 1000.0000 mg | ORAL_CAPSULE | Freq: Two times a day (BID) | ORAL | 0 refills | Status: DC

## 2018-03-15 NOTE — Patient Instructions (Addendum)
Stop afrin.  Start prednisone taper over 6 days.  Start antihistamine.. zrytec at bedtime.  Continue netty pot.  Rest, fluids.  If not feeling better in 4-5 days, or new fever.. Start antibiotics.

## 2018-03-15 NOTE — Progress Notes (Signed)
   Subjective:    Patient ID: Jessica Aguirre, female    DOB: Mar 15, 1971, 47 y.o.   MRN: [id number]  Sinus Problem  This is a new problem. The current episode started in the past 7 days. The problem has been gradually worsening since onset. There has been no fever. Associated symptoms include congestion and sinus pressure. Pertinent negatives include no chills, coughing, ear pain, neck pain, shortness of breath or sore throat. (Left sided facial pain, severe headache  nasal drainage  bilateral ear pressure) Past treatments include saline sprays ( using afin nasal spray). The treatment provided mild relief.   Sneezing, water eyes. No sick contacts.  No antibiotics in last month.    She is immunocompromised on plaquenil.   Had flu shot earlier in week.    Review of Systems  Constitutional: Negative for chills.  HENT: Positive for congestion and sinus pressure. Negative for ear pain and sore throat.   Respiratory: Negative for cough and shortness of breath.   Musculoskeletal: Negative for neck pain.       Objective:   Physical Exam  Constitutional: Vital signs are normal. She appears well-developed and well-nourished. She is cooperative.  Non-toxic appearance. She does not appear ill. No distress.  HENT:  Head: Normocephalic.  Right Ear: Hearing, external ear and ear canal normal. Tympanic membrane is not erythematous, not retracted and not bulging. A middle ear effusion is present.  Left Ear: Hearing, external ear and ear canal normal. Tympanic membrane is not erythematous, not retracted and not bulging. A middle ear effusion is present.  Nose: Mucosal edema and rhinorrhea present. Right sinus exhibits no maxillary sinus tenderness and no frontal sinus tenderness. Left sinus exhibits no maxillary sinus tenderness and no frontal sinus tenderness.  Mouth/Throat: Uvula is midline, oropharynx is clear and moist and mucous membranes are normal.  Eyes: Pupils are equal, round, and  reactive to light. Conjunctivae, EOM and lids are normal. Lids are everted and swept, no foreign bodies found.   watery  Neck: Trachea normal and normal range of motion. Neck supple. Carotid bruit is not present. No thyroid mass and no thyromegaly present.  Cardiovascular: Normal rate, regular rhythm, S1 normal, S2 normal, normal heart sounds, intact distal pulses and normal pulses. Exam reveals no gallop and no friction rub.  No murmur heard. Pulmonary/Chest: Effort normal and breath sounds normal. No tachypnea. No respiratory distress. She has no decreased breath sounds. She has no wheezes. She has no rhonchi. She has no rales.  Neurological: She is alert.  Skin: Skin is warm, dry and intact. No rash noted.  Psychiatric: Her speech is normal and behavior is normal. Judgment normal. Her mood appears not anxious. Cognition and memory are normal. She does not exhibit a depressed mood.          Assessment & Plan:

## 2018-03-15 NOTE — Assessment & Plan Note (Signed)
Allergic or viral but high risk for bacterial ( immunocompromised on plaquenil).  treat with steroid taper, antihistamine.  If not improving or new fever, start antibiotics.

## 2018-03-26 ENCOUNTER — Ambulatory Visit
Admission: RE | Admit: 2018-03-26 | Discharge: 2018-03-26 | Disposition: A | Discharge status: Home / Self Care | Payer: BC Managed Care – PPO | Source: Ambulatory Visit

## 2018-03-26 ENCOUNTER — Encounter: Payer: Self-pay | Admitting: *Deleted

## 2018-03-26 DIAGNOSIS — Z1231 Encounter for screening mammogram for malignant neoplasm of breast: Secondary | ICD-10-CM

## 2018-04-16 ENCOUNTER — Encounter: Payer: Self-pay | Admitting: Radiology

## 2018-05-17 ENCOUNTER — Other Ambulatory Visit: Payer: Self-pay | Admitting: Family Medicine

## 2018-05-20 ENCOUNTER — Other Ambulatory Visit (INDEPENDENT_AMBULATORY_CARE_PROVIDER_SITE_OTHER): Payer: BC Managed Care – PPO

## 2018-05-20 ENCOUNTER — Telehealth: Payer: Self-pay | Admitting: Family Medicine

## 2018-05-20 DIAGNOSIS — E7800 Pure hypercholesterolemia, unspecified: Secondary | ICD-10-CM | POA: Diagnosis not present

## 2018-05-20 LAB — LIPID PANEL
CHOLESTEROL: 238 mg/dL — AB (ref 0–200)
HDL: 87.4 mg/dL (ref >39.00)
LDL Cholesterol: 140 mg/dL — ABNORMAL HIGH (ref 0–99)
NonHDL: 151.02
TRIGLYCERIDES: 53 mg/dL (ref 0.0–149.0)
Total CHOL/HDL Ratio: 3
VLDL: 10.6 mg/dL (ref 0.0–40.0)

## 2018-05-20 LAB — COMPREHENSIVE METABOLIC PANEL
ALBUMIN: 4.3 g/dL (ref 3.5–5.2)
ALK PHOS: 51 U/L (ref 39–117)
ALT: 21 U/L (ref 0–35)
AST: 26 U/L (ref 0–37)
BILIRUBIN TOTAL: 0.3 mg/dL (ref 0.2–1.2)
BUN: 13 mg/dL (ref 6–23)
CALCIUM: 9.6 mg/dL (ref 8.4–10.5)
CO2: 28 mEq/L (ref 19–32)
CREATININE: 0.6 mg/dL (ref 0.40–1.20)
Chloride: 103 mEq/L (ref 96–112)
GFR: 114.35 mL/min (ref >60.00)
Glucose, Bld: 85 mg/dL (ref 70–99)
Potassium: 4.1 mEq/L (ref 3.5–5.1)
Sodium: 138 mEq/L (ref 135–145)
Total Protein: 7.1 g/dL (ref 6.0–8.3)

## 2018-05-20 NOTE — Telephone Encounter (Signed)
-----   Message from Lendon Collar, RT sent at 05/14/2018  8:08 AM EST ----- Regarding: Lab orders for Monday Dec 16th Please enter CPE lab orders for 05/20/18. Thanks!

## 2018-05-24 ENCOUNTER — Ambulatory Visit (INDEPENDENT_AMBULATORY_CARE_PROVIDER_SITE_OTHER): Payer: BC Managed Care – PPO | Admitting: Family Medicine

## 2018-05-24 ENCOUNTER — Encounter: Payer: Self-pay | Admitting: Family Medicine

## 2018-05-24 VITALS — BP 100/64 | HR 66 | Temp 98.4°F | Ht 69.0 in | Wt 233.0 lb

## 2018-05-24 DIAGNOSIS — E669 Obesity, unspecified: Secondary | ICD-10-CM

## 2018-05-24 DIAGNOSIS — M3500 Sicca syndrome, unspecified: Secondary | ICD-10-CM | POA: Diagnosis not present

## 2018-05-24 DIAGNOSIS — E7800 Pure hypercholesterolemia, unspecified: Secondary | ICD-10-CM | POA: Diagnosis not present

## 2018-05-24 DIAGNOSIS — Z0000 Encounter for general adult medical examination without abnormal findings: Secondary | ICD-10-CM

## 2018-05-24 NOTE — Assessment & Plan Note (Signed)
Followed by [facility] rheum.

## 2018-05-24 NOTE — Assessment & Plan Note (Signed)
Increase red yeast rice to 2 cap twice daily. Encouraged exercise, weight loss, healthy eating habits.

## 2018-05-24 NOTE — Patient Instructions (Addendum)
Red yeast rice increase to 600 mg capsules 2 capsule twice daily.  You can schedule 3 month chol check to eval effect of red yeast rice.

## 2018-05-24 NOTE — Progress Notes (Signed)
Subjective:    Patient ID: Jessica Aguirre, female    DOB: 10-04-1970, 47 y.o.   MRN: [id number]  HPI  The patient is here for annual wellness exam and preventative care. Red yeast rice one daily... no change.    Elevated Cholesterol: She is using   Lab Results  Component Value Date   CHOL 238 (H) 05/20/2018   HDL 87.40 05/20/2018   LDLCALC 140 (H) 05/20/2018   LDLDIRECT 142.3 01/09/2013   TRIG 53.0 05/20/2018   CHOLHDL 3 05/20/2018  Using medications without problems: Muscle aches:  Diet compliance: healthy diet Exercise: 5-6 times a week. Other complaints:  Sjogeren's: Followed by [facility] rheumatology  Obesity  Wt Readings from Last 3 Encounters:  05/24/18 233 lb (105.7 kg)  03/15/18 240 lb (108.9 kg)  02/27/18 244 lb (110.7 kg)  Body mass index is 34.41 kg/m.   Social History /Family History/Past Medical History reviewed in detail and updated in EMR if needed. Blood pressure 100/64, pulse 66, temperature 98.4 F (36.9 C), temperature source Oral, height 5\' 9"  (1.753 m), weight 233 lb (105.7 kg), SpO2 100 %.  Review of Systems  Constitutional: Negative for fatigue and fever.  HENT: Negative for congestion.   Eyes: Negative for pain.  Respiratory: Negative for cough and shortness of breath.   Cardiovascular: Negative for chest pain, palpitations and leg swelling.  Gastrointestinal: Negative for abdominal pain.  Genitourinary: Negative for dysuria and vaginal bleeding.  Musculoskeletal: Negative for back pain.  Neurological: Negative for syncope, light-headedness and headaches.  Psychiatric/Behavioral: Negative for dysphoric mood.       Objective:   Physical Exam Constitutional:      General: She is not in acute distress.    Appearance: Normal appearance. She is well-developed. She is obese. She is not ill-appearing or toxic-appearing.  HENT:     Head: Normocephalic.     Right Ear: Hearing, tympanic membrane, ear canal and external ear normal.     Left  Ear: Hearing, tympanic membrane, ear canal and external ear normal.     Nose: Nose normal.  Eyes:     General: Lids are normal. Lids are everted, no foreign bodies appreciated.     Conjunctiva/sclera: Conjunctivae normal.     Pupils: Pupils are equal, round, and reactive to light.  Neck:     Musculoskeletal: Normal range of motion and neck supple.     Thyroid: No thyroid mass or thyromegaly.     Vascular: No carotid bruit.     Trachea: Trachea normal.  Cardiovascular:     Rate and Rhythm: Normal rate and regular rhythm.     Heart sounds: Normal heart sounds, S1 normal and S2 normal. No murmur. No gallop.   Pulmonary:     Effort: Pulmonary effort is normal. No respiratory distress.     Breath sounds: Normal breath sounds. No wheezing, rhonchi or rales.  Abdominal:     General: Bowel sounds are normal. There is no distension or abdominal bruit.     Palpations: Abdomen is soft. There is no fluid wave or mass.     Tenderness: There is no abdominal tenderness. There is no guarding or rebound.     Hernia: No hernia is present.  Lymphadenopathy:     Cervical: No cervical adenopathy.  Skin:    General: Skin is warm and dry.     Findings: No rash.  Neurological:     Mental Status: She is alert.     Cranial Nerves: No cranial nerve  deficit.     Sensory: No sensory deficit.  Psychiatric:        Mood and Affect: Mood is not anxious or depressed.        Speech: Speech normal.        Behavior: Behavior normal. Behavior is cooperative.        Judgment: Judgment normal.           Assessment & Plan:  The patient's preventative maintenance and recommended screening tests for an annual wellness exam were reviewed in full today. Brought up to date unless services declined.  Counselled on the importance of diet, exercise, and its role in overall health and mortality. The patient's FH and SH was reviewed, including their home life, tobacco status, and drug and alcohol status.    pap/DVE:per gYN, nml pap 2019, plane repeat 5 years. Mammo: plan every 2 years. 03/2018 nml Vaccines: Uptodate withpneumonia vaccine given immunocomp, TDAP, flu uptodate Nonsmoker Colon: no early  family colon cancer. No desire for STD testing.  Wine 1 glass nightly.

## 2018-06-03 ENCOUNTER — Other Ambulatory Visit: Payer: Self-pay | Admitting: Family Medicine

## 2018-07-08 ENCOUNTER — Telehealth: Payer: Self-pay | Admitting: Family Medicine

## 2018-07-08 MED ORDER — OSELTAMIVIR PHOSPHATE 75 MG PO CAPS: 75.0000 mg | ORAL_CAPSULE | Freq: Two times a day (BID) | ORAL | 0 refills | Status: DC

## 2018-07-08 NOTE — Telephone Encounter (Signed)
Tamiflu 75 mg, 1 po daily, #10

## 2018-07-08 NOTE — Telephone Encounter (Signed)
OK, I was unclear from the verbal message I received.   No need to come in and we really can't do that anyway.   I will just put her on tamiflu now given risk  Tamiflu 75 mg, 1 po bid, #10

## 2018-07-08 NOTE — Telephone Encounter (Signed)
Pt called office requesting to get a flu test done. Her husband Addison Lehenbauer went to [facility] and was admitted due to the flu. Her husband is a pt of Dr.Duncan's and would like to come in to be tested because she is starting to having a dry cough and throat soreness. Pt wants to come in today.

## 2018-07-08 NOTE — Telephone Encounter (Signed)
Patient prefers [facility] on [address] [employer]

## 2018-07-08 NOTE — Telephone Encounter (Signed)
RX sent in for BID and patient advised.

## 2018-07-08 NOTE — Telephone Encounter (Signed)
Please see previous note.

## 2018-07-09 NOTE — Telephone Encounter (Signed)
Thanks for taking care of this.

## 2018-08-22 ENCOUNTER — Other Ambulatory Visit: Payer: Self-pay | Admitting: Family Medicine

## 2018-10-18 ENCOUNTER — Telehealth: Payer: BC Managed Care – PPO | Admitting: Family

## 2018-10-18 DIAGNOSIS — B9789 Other viral agents as the cause of diseases classified elsewhere: Secondary | ICD-10-CM | POA: Diagnosis not present

## 2018-10-18 DIAGNOSIS — J329 Chronic sinusitis, unspecified: Secondary | ICD-10-CM

## 2018-10-18 MED ORDER — FLUTICASONE PROPIONATE 50 MCG/ACT NA SUSP: 1.0000 | Freq: Two times a day (BID) | NASAL | 6 refills | Status: DC

## 2018-10-18 NOTE — Progress Notes (Signed)
Greater than 5 minutes, yet less than 10 minutes of time have been spent researching, coordinating, and implementing care for this patient today.  Thank you for the details you included in the comment boxes. Those details are very helpful in determining the best course of treatment for you and help us to provide the best care.  We are sorry that you are not feeling well.  Here is how we plan to help!  Based on what you have shared with me it looks like you have sinusitis.  Sinusitis is inflammation and infection in the sinus cavities of the head.  Based on your presentation I believe you most likely have Acute Viral Sinusitis.This is an infection most likely caused by a virus. There is not specific treatment for viral sinusitis other than to help you with the symptoms until the infection runs its course.  You may use an oral decongestant such as Mucinex D or if you have glaucoma or high blood pressure use plain Mucinex. Saline nasal spray help and can safely be used as often as needed for congestion, I have prescribed: Fluticasone nasal spray two sprays in each nostril once a day  Some authorities believe that zinc sprays or the use of Echinacea may shorten the course of your symptoms.  Sinus infections are not as easily transmitted as other respiratory infection, however we still recommend that you avoid close contact with loved ones, especially the very young and elderly.  Remember to wash your hands thoroughly throughout the day as this is the number one way to prevent the spread of infection!  Home Care:  Only take medications as instructed by your medical team.  Do not take these medications with alcohol.  A steam or ultrasonic humidifier can help congestion.  You can place a towel over your head and breathe in the steam from hot water coming from a faucet.  Avoid close contacts especially the very young and the elderly.  Cover your mouth when you cough or sneeze.  Always remember to  wash your hands.  Get Help Right Away If:  You develop worsening fever or sinus pain.  You develop a severe head ache or visual changes.  Your symptoms persist after you have completed your treatment plan.  Make sure you  Understand these instructions.  Will watch your condition.  Will get help right away if you are not doing well or get worse.  Your e-visit answers were reviewed by a board certified advanced clinical practitioner to complete your personal care plan.  Depending on the condition, your plan could have included both over the counter or prescription medications.  If there is a problem please reply  once you have received a response from your provider.  Your safety is important to us.  If you have drug allergies check your prescription carefully.    You can use MyChart to ask questions about today's visit, request a non-urgent call back, or ask for a work or school excuse for 24 hours related to this e-Visit. If it has been greater than 24 hours you will need to follow up with your provider, or enter a new e-Visit to address those concerns.  You will get an e-mail in the next two days asking about your experience.  I hope that your e-visit has been valuable and will speed your recovery. Thank you for using e-visits.    

## 2018-12-26 ENCOUNTER — Encounter: Payer: Self-pay | Admitting: Family Medicine

## 2018-12-26 ENCOUNTER — Ambulatory Visit: Payer: BC Managed Care – PPO | Admitting: Family Medicine

## 2018-12-26 ENCOUNTER — Other Ambulatory Visit: Payer: Self-pay

## 2018-12-26 VITALS — BP 112/70 | HR 72 | Temp 97.9°F | Ht 69.0 in

## 2018-12-26 DIAGNOSIS — H6592 Unspecified nonsuppurative otitis media, left ear: Secondary | ICD-10-CM | POA: Diagnosis not present

## 2018-12-26 MED ORDER — AMOXICILLIN 500 MG PO CAPS: 1000.0000 mg | ORAL_CAPSULE | Freq: Two times a day (BID) | ORAL | 0 refills | Status: DC

## 2018-12-26 NOTE — Assessment & Plan Note (Signed)
Treat with nasal steroid, ibuprofen and course of antibiotics.

## 2018-12-26 NOTE — Patient Instructions (Signed)
Complete antibiotics.  Continue ibuprofen 800 mg three times daily.  Increase flonase to 2 sprays per nostril daily.  Call if not improving by completion of antibiotics.

## 2018-12-26 NOTE — Progress Notes (Signed)
Chief Complaint  Patient presents with  . Ear Pain    C/o left ear pain. Started 2 days ago. Has some swelling on left side of neck.  States opening her mouth causes ear pain. Denies fever or other sxs.     History of Present Illness: Otalgia  There is pain in the left ear. This is a new problem. The current episode started in the past 7 days. The problem has been gradually worsening. There has been no fever. The pain is moderate. Associated symptoms include neck pain. Pertinent negatives include no abdominal pain, coughing, diarrhea, ear discharge, rash, sore throat or vomiting. Associated symptoms comments:  Sore on left lateral neck.. ? swollen. She has tried NSAIDs for the symptoms. The treatment provided mild relief. There is no history of a chronic ear infection, hearing loss or a tympanostomy tube.  Worse with opening mouth.   On flonase 1 spray per nostril daily.   Saw ENT in past... 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   COVID 19 screen No recent travel or known exposure to COVID19 The patient denies respiratory symptoms of COVID 19 at this time.  The importance of social distancing was discussed today.   Review of Systems  Constitutional: Negative for chills and fever.  HENT: Positive for ear pain. Negative for congestion, ear discharge and sore throat.   Eyes: Negative for pain and redness.  Respiratory: Negative for cough and shortness of breath.   Cardiovascular: Negative for chest pain, palpitations and leg swelling.  Gastrointestinal: Negative for abdominal pain, blood in stool, constipation, diarrhea, nausea and vomiting.  Genitourinary: Negative for dysuria.  Musculoskeletal: Positive for neck pain. Negative for falls and myalgias.  Skin: Negative for rash.  Neurological: Negative for dizziness.  Psychiatric/Behavioral: Negative for depression. The patient is not nervous/anxious.       Past Medical History:  Diagnosis Date  . 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 Sjogren's  disease ([place])     reports that she has never smoked. She has never used smokeless tobacco. She reports that she does not drink alcohol or use drugs.   Current Outpatient Medications:  .  Ascorbic Acid (VITAMIN C) 1000 MG tablet, Take 1,000 mg by mouth daily., Disp: , Rfl:  .  Calcium Citrate 200 MG TABS, Take by mouth., Disp: , Rfl:  .  Cholecalciferol (D 1000) 1000 UNITS CHEW, Chew by mouth., Disp: , Rfl:  .  COD LIVER OIL PO, Take 1 tablet by mouth daily., Disp: , Rfl:  .  Elagolix Sodium (ORILISSA) 150 MG TABS, Take 150 mg by mouth daily., Disp: 30 tablet, Rfl: 11 .  ferrous sulfate (FER-IN-SOL) 75 (15 FE) MG/ML SOLN, Take 15 mg of iron by mouth daily., Disp: , Rfl:  .  FLUoxetine (PROZAC) 20 MG tablet, TAKE 1 TABLET (20 MG TOTAL) BY MOUTH DAILY., Disp: 90 tablet, Rfl: 1 .  fluticasone (FLONASE) 50 MCG/ACT nasal spray, Place 1 spray into both nostrils 2 (two) times daily., Disp: 16 g, Rfl: 6 .  hydroxychloroquine (PLAQUENIL) 200 MG tablet, Take by mouth 2 (two) times daily., Disp: , Rfl:  .  multivitamin-iron-minerals-folic acid (CENTRUM) chewable tablet, Chew 2 tablets by mouth daily., Disp: , Rfl:  .  NIFEdipine (PROCARDIA-XL/NIFEDICAL-XL) 30 MG 24 hr tablet, TAKE 1 TABLET BY MOUTH EVERY DAY, Disp: 90 tablet, Rfl: 1 .  oseltamivir (TAMIFLU) 75 MG capsule, Take 1 capsule (75 mg total) by mouth 2 (two) times daily., Disp: 10 capsule, Rfl: 0   Observations/Objective: Blood pressure  112/70, pulse 72, temperature 97.9 F (36.6 C), temperature source Temporal, height 5\' 9"  (1.753 m), last menstrual period 12/22/2018, SpO2 97 %.  Physical Exam Constitutional:      General: She is not in acute distress.    Appearance: She is well-developed. She is not ill-appearing or toxic-appearing.  HENT:     Head: Normocephalic.     Right Ear: Hearing, ear canal and external ear normal. No middle ear effusion. Tympanic membrane is not erythematous, retracted or bulging.     Left Ear: Hearing, ear canal  and external ear normal. A middle ear effusion is present. Tympanic membrane is injected and bulging. Tympanic membrane is not retracted.     Nose: Mucosal edema and rhinorrhea present.     Right Sinus: No maxillary sinus tenderness or frontal sinus tenderness.     Left Sinus: No maxillary sinus tenderness or frontal sinus tenderness.     Mouth/Throat:     Pharynx: Uvula midline.  Eyes:     General: Lids are normal. Lids are everted, no foreign bodies appreciated.     Conjunctiva/sclera: Conjunctivae normal.     Pupils: Pupils are equal, round, and reactive to light.  Neck:     Musculoskeletal: Normal range of motion and neck supple.     Thyroid: No thyroid mass or thyromegaly.     Vascular: No carotid bruit.     Trachea: Trachea normal.  Cardiovascular:     Rate and Rhythm: Normal rate and regular rhythm.     Pulses: Normal pulses.     Heart sounds: Normal heart sounds, S1 normal and S2 normal. No murmur. No friction rub. No gallop.   Pulmonary:     Effort: Pulmonary effort is normal. No tachypnea or respiratory distress.     Breath sounds: Normal breath sounds. No decreased breath sounds, wheezing, rhonchi or rales.  Lymphadenopathy:     Cervical: Cervical adenopathy present.     Left cervical: Superficial cervical adenopathy present.     Comments: No parotid swelling.  No ttp over TMJ  Skin:    General: Skin is warm and dry.     Findings: No rash.  Neurological:     Mental Status: She is alert.  Psychiatric:        Mood and Affect: Mood is not anxious or depressed.        Speech: Speech normal.        Behavior: Behavior normal. Behavior is cooperative.        Judgment: Judgment normal.      Assessment and Plan      Eliezer Lofts, MD

## 2019-02-09 ENCOUNTER — Other Ambulatory Visit: Payer: Self-pay | Admitting: Family Medicine

## 2019-02-14 ENCOUNTER — Other Ambulatory Visit: Payer: Self-pay

## 2019-02-14 ENCOUNTER — Ambulatory Visit (INDEPENDENT_AMBULATORY_CARE_PROVIDER_SITE_OTHER): Payer: BC Managed Care – PPO

## 2019-02-14 DIAGNOSIS — Z23 Encounter for immunization: Secondary | ICD-10-CM

## 2019-05-26 ENCOUNTER — Other Ambulatory Visit: Payer: Self-pay | Admitting: Family Medicine

## 2019-05-26 ENCOUNTER — Other Ambulatory Visit: Payer: BC Managed Care – PPO

## 2019-05-26 ENCOUNTER — Telehealth: Payer: Self-pay | Admitting: Family Medicine

## 2019-05-26 ENCOUNTER — Other Ambulatory Visit: Payer: Self-pay

## 2019-05-26 DIAGNOSIS — Z01419 Encounter for gynecological examination (general) (routine) without abnormal findings: Secondary | ICD-10-CM

## 2019-05-26 DIAGNOSIS — E7800 Pure hypercholesterolemia, unspecified: Secondary | ICD-10-CM

## 2019-05-26 MED ORDER — FLUOXETINE HCL 20 MG PO TABS: 20.0000 mg | ORAL_TABLET | Freq: Every day | ORAL | 0 refills | Status: DC

## 2019-05-26 NOTE — Telephone Encounter (Signed)
-----   Message from Ellamae Sia sent at 05/20/2019  2:25 PM EST ----- Regarding: Lab orders for Monday, 12.21.20 Patient is scheduled for CPX labs, please order future labs, Thanks , Karna Christmas

## 2019-05-26 NOTE — Telephone Encounter (Signed)
Originally prescribed by Dr. Lauretta Chester. CPE scheduled 07/08/2019.

## 2019-05-29 ENCOUNTER — Encounter: Payer: BC Managed Care – PPO | Admitting: Family Medicine

## 2019-06-04 ENCOUNTER — Encounter: Payer: BC Managed Care – PPO | Admitting: Family Medicine

## 2019-06-30 ENCOUNTER — Telehealth: Payer: Self-pay

## 2019-06-30 NOTE — Telephone Encounter (Signed)
LVM to call clinic, needs  COVID screen, front door and back lab info 1.25.2021 TLJ

## 2019-07-02 ENCOUNTER — Other Ambulatory Visit (INDEPENDENT_AMBULATORY_CARE_PROVIDER_SITE_OTHER): Payer: BC Managed Care – PPO

## 2019-07-02 ENCOUNTER — Other Ambulatory Visit: Payer: Self-pay

## 2019-07-02 DIAGNOSIS — E7800 Pure hypercholesterolemia, unspecified: Secondary | ICD-10-CM

## 2019-07-02 LAB — COMPREHENSIVE METABOLIC PANEL
ALT: 14 U/L (ref 0–35)
AST: 17 U/L (ref 0–37)
Albumin: 4.2 g/dL (ref 3.5–5.2)
Alkaline Phosphatase: 61 U/L (ref 39–117)
BUN: 10 mg/dL (ref 6–23)
CO2: 28 mEq/L (ref 19–32)
Calcium: 9.2 mg/dL (ref 8.4–10.5)
Chloride: 103 mEq/L (ref 96–112)
Creatinine, Ser: 0.64 mg/dL (ref 0.40–1.20)
GFR: 99.38 mL/min (ref >60.00)
Glucose, Bld: 81 mg/dL (ref 70–99)
Potassium: 3.9 mEq/L (ref 3.5–5.1)
Sodium: 137 mEq/L (ref 135–145)
Total Bilirubin: 0.6 mg/dL (ref 0.2–1.2)
Total Protein: 7 g/dL (ref 6.0–8.3)

## 2019-07-02 LAB — LIPID PANEL
Cholesterol: 225 mg/dL — ABNORMAL HIGH (ref 0–200)
HDL: 92.5 mg/dL (ref >39.00)
LDL Cholesterol: 123 mg/dL — ABNORMAL HIGH (ref 0–99)
NonHDL: 132.49
Total CHOL/HDL Ratio: 2
Triglycerides: 47 mg/dL (ref 0.0–149.0)
VLDL: 9.4 mg/dL (ref 0.0–40.0)

## 2019-07-08 ENCOUNTER — Encounter: Payer: BC Managed Care – PPO | Admitting: Family Medicine

## 2019-08-07 ENCOUNTER — Encounter: Payer: Self-pay | Admitting: Family Medicine

## 2019-08-07 ENCOUNTER — Other Ambulatory Visit: Payer: Self-pay

## 2019-08-07 ENCOUNTER — Ambulatory Visit: Payer: BC Managed Care – PPO | Admitting: Family Medicine

## 2019-08-07 DIAGNOSIS — E669 Obesity, unspecified: Secondary | ICD-10-CM | POA: Diagnosis not present

## 2019-08-07 DIAGNOSIS — E7800 Pure hypercholesterolemia, unspecified: Secondary | ICD-10-CM | POA: Diagnosis not present

## 2019-08-07 DIAGNOSIS — M3500 Sicca syndrome, unspecified: Secondary | ICD-10-CM | POA: Diagnosis not present

## 2019-08-07 DIAGNOSIS — F321 Major depressive disorder, single episode, moderate: Secondary | ICD-10-CM | POA: Diagnosis not present

## 2019-08-07 NOTE — Assessment & Plan Note (Addendum)
Increase fluoxetine to 40 mg daily ( 2x 20 mg).  Refer to counseling  Close follow up.

## 2019-08-07 NOTE — Progress Notes (Signed)
Chief Complaint  Patient presents with  . Annual Exam    History of Present Illness: HPI The patient is here for annual wellness exam and preventative care.    Raynaud's, Sjogren's: Followed by Rheumatology. On plaquenil and nifedipine  Has gotten first COVID shot.   Elevated Cholesterol:  Improved control with diet. and red yeast rice Lab Results  Component Value Date   CHOL 225 (H) 07/02/2019   HDL 92.50 07/02/2019   LDLCALC 123 (H) 07/02/2019   LDLDIRECT 142.3 01/09/2013   TRIG 47.0 07/02/2019   CHOLHDL 2 07/02/2019  Using medications without problems: Muscle aches:  Diet compliance: moderate Exercise: none Other complaints:  Obesity Body mass index is 41.95 kg/m. Wt Readings from Last 3 Encounters:  08/07/19 280 lb (127 kg)  05/24/18 233 lb (105.7 kg)  03/15/18 240 lb (108.9 kg)     She has been much more isolated , in stress for COVID19. She reports worsening depression and anxiety. No longer getting exercise.  Poor sleep.  PHQ9 today: 18  She has been on prozac 20 mg for seasonal affective do.. on for many years.   Office Visit from 05/24/2018 in [place] at [facility] Total Score  0      Endometriosis followed by Dr. Ernestina Patches... was helping... having trouble refilling.  Last OV 02/2018   This visit occurred during the SARS-CoV-2 public health emergency.  Safety protocols were in place, including screening questions prior to the visit, additional usage of staff PPE, and extensive cleaning of exam room while observing appropriate contact time as indicated for disinfecting solutions.   COVID 19 screen:  No recent travel or known exposure to COVID19 The patient denies respiratory symptoms of COVID 19 at this time. The importance of social distancing was discussed today.     Review of Systems  Constitutional: Negative for chills and fever.  HENT: Negative for congestion and ear pain.   Eyes: Negative for pain and redness.  Respiratory:  Negative for cough and shortness of breath.   Cardiovascular: Negative for chest pain, palpitations and leg swelling.  Gastrointestinal: Negative for abdominal pain, blood in stool, constipation, diarrhea, nausea and vomiting.  Genitourinary: Negative for dysuria.  Musculoskeletal: Negative for falls and myalgias.  Skin: Negative for rash.  Neurological: Negative for dizziness.  Psychiatric/Behavioral: Negative for depression. The patient is not nervous/anxious.       Past Medical History:  Diagnosis Date  . 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 Sjogren's disease ([place])     reports that she has never smoked. She has never used smokeless tobacco. She reports that she does not drink alcohol or use drugs.   Current Outpatient Medications:  .  Ascorbic Acid (VITAMIN C) 1000 MG tablet, Take 1,000 mg by mouth daily., Disp: , Rfl:  .  Calcium Citrate 200 MG TABS, Take by mouth., Disp: , Rfl:  .  Cholecalciferol (D 1000) 1000 UNITS CHEW, Chew by mouth., Disp: , Rfl:  .  COD LIVER OIL PO, Take 1 tablet by mouth daily., Disp: , Rfl:  .  ferrous sulfate (FER-IN-SOL) 75 (15 FE) MG/ML SOLN, Take 15 mg of iron by mouth daily., Disp: , Rfl:  .  FLUoxetine (PROZAC) 20 MG tablet, Take 1 tablet (20 mg total) by mouth daily., Disp: 90 tablet, Rfl: 0 .  fluticasone (FLONASE) 50 MCG/ACT nasal spray, Place 1 spray into both nostrils 2 (two) times daily., Disp: 16 g, Rfl: 6 .  hydroxychloroquine (PLAQUENIL) 200 MG tablet, Take by  mouth 2 (two) times daily., Disp: , Rfl:  .  multivitamin-iron-minerals-folic acid (CENTRUM) chewable tablet, Chew 2 tablets by mouth daily., Disp: , Rfl:  .  NIFEdipine (PROCARDIA-XL/NIFEDICAL-XL) 30 MG 24 hr tablet, TAKE 1 TABLET BY MOUTH EVERY DAY, Disp: 90 tablet, Rfl: 1   Observations/Objective: Blood pressure 110/72, pulse 77, temperature 98.5 F (36.9 C), temperature source Temporal, height 5' 8.5" (1.74 m), weight 280 lb (127 kg), last menstrual period 08/02/2019, SpO2 97  %.  Physical Exam Constitutional:      General: She is not in acute distress.    Appearance: Normal appearance. She is well-developed. She is obese. She is not ill-appearing or toxic-appearing.  HENT:     Head: Normocephalic.     Right Ear: Hearing, tympanic membrane, ear canal and external ear normal.     Left Ear: Hearing, tympanic membrane, ear canal and external ear normal.     Nose: Nose normal.  Eyes:     General: Lids are normal. Lids are everted, no foreign bodies appreciated.     Conjunctiva/sclera: Conjunctivae normal.     Pupils: Pupils are equal, round, and reactive to light.  Neck:     Thyroid: No thyroid mass or thyromegaly.     Vascular: No carotid bruit.     Trachea: Trachea normal.  Cardiovascular:     Rate and Rhythm: Normal rate and regular rhythm.     Heart sounds: Normal heart sounds, S1 normal and S2 normal. No murmur. No gallop.   Pulmonary:     Effort: Pulmonary effort is normal. No respiratory distress.     Breath sounds: Normal breath sounds. No wheezing, rhonchi or rales.  Abdominal:     General: Bowel sounds are normal. There is no distension or abdominal bruit.     Palpations: Abdomen is soft. There is no fluid wave or mass.     Tenderness: There is no abdominal tenderness. There is no guarding or rebound.     Hernia: No hernia is present.  Musculoskeletal:     Cervical back: Normal range of motion and neck supple.  Lymphadenopathy:     Cervical: No cervical adenopathy.  Skin:    General: Skin is warm and dry.     Findings: No rash.  Neurological:     Mental Status: She is alert.     Cranial Nerves: No cranial nerve deficit.     Sensory: No sensory deficit.  Psychiatric:        Mood and Affect: Mood is not anxious or depressed.        Speech: Speech normal.        Behavior: Behavior normal. Behavior is cooperative.        Judgment: Judgment normal.      Assessment and Plan   The patient's preventative maintenance and recommended  screening tests for an annual wellness exam were reviewed in full today. Brought up to date unless services declined.  Counselled on the importance of diet, exercise, and its role in overall health and mortality. The patient's FH and SH was reviewed, including their home life, tobacco status, and drug and alcohol status.   pap/DVE:per GYN, nml pap 2019, plan repeat 5 years. Mammo: plan every 2 years.03/2018 nml... scheduled Vaccines: Uptodate withpneumonia vaccine given immunocomp, TDAP, flu uptodate Nonsmoker Colon: no early  family colon cancer. No desire for STD testing.  Wine 1 glass nightly.  MDD (major depressive disorder), single episode, moderate (HCC)   Increase fluoxetine to 40 mg daily ( 2x  20 mg).  Refer to counseling  Close follow up.  High cholesterol Improved control with diet and red yeast rice.  Obesity (BMI 35.0-39.9 without comorbidity) Encouraged exercise, weight loss, healthy eating habits.  Discussed treatment options.   Eliezer Lofts, MD

## 2019-08-07 NOTE — Assessment & Plan Note (Signed)
Improved control with diet and red yeast rice.

## 2019-08-07 NOTE — Patient Instructions (Addendum)
Increase fluoxetine to 40 mg daily.  Call the number for psychology referral.

## 2019-08-07 NOTE — Assessment & Plan Note (Signed)
Encouraged exercise, weight loss, healthy eating habits.  Discussed treatment options.

## 2019-08-07 NOTE — Assessment & Plan Note (Signed)
Followed by Rheumatology

## 2019-08-13 ENCOUNTER — Ambulatory Visit: Payer: BC Managed Care – PPO | Admitting: Family Medicine

## 2019-08-13 ENCOUNTER — Encounter: Payer: Self-pay | Admitting: Family Medicine

## 2019-08-13 ENCOUNTER — Other Ambulatory Visit: Payer: Self-pay

## 2019-08-13 VITALS — BP 116/81 | HR 81 | Wt 276.2 lb

## 2019-08-13 DIAGNOSIS — N809 Endometriosis, unspecified: Secondary | ICD-10-CM | POA: Diagnosis not present

## 2019-08-13 DIAGNOSIS — N946 Dysmenorrhea, unspecified: Secondary | ICD-10-CM | POA: Diagnosis not present

## 2019-08-13 MED ORDER — ORILISSA 150 MG PO TABS: 150.0000 mg | ORAL_TABLET | Freq: Every day | ORAL | 12 refills | Status: DC

## 2019-08-13 NOTE — Progress Notes (Signed)
   GYNECOLOGY PROBLEM  VISIT ENCOUNTER NOTE  Subjective:   Jessica Aguirre is a 49 y.o. No obstetric history on file. female here for a a problem GYN visit.    Last seen in 2019 and started on Orlissa as a trial for suspected endometriosis and continued until Nov 2020. At that time she was having regular pelvic pain.  Starting in 2018 she reports 5-6 days of excruciating pain preceding her period associated with a burning sensation. Reports heavy and very painful periods for "years". Denies pain with sex. Reports some ovarian pain associated with her cycle.   Mother has hysterectomy at 31-49yo unknown reason.   Since last seen her symtoms improved with Orlissa and then when off returned.   Denies abnormal vaginal bleeding, discharge, pelvic pain, problems with intercourse or other gynecologic concerns.    Gynecologic History Patient's last menstrual period was 08/02/2019. Contraception: none  There are no preventive care reminders to display for this patient.   The following portions of the patient's history were reviewed and updated as appropriate: allergies, current medications, past family history, past medical history, past social history, past surgical history and problem list.  Review of Systems Pertinent items are noted in HPI.   Objective:  BP 116/81   Pulse 81   Wt 276 lb 3.2 oz (125.3 kg)   LMP 08/02/2019   BMI 41.39 kg/m  Gen: well appearing, NAD HEENT: no scleral icterus CV: RR Lung: Normal WOB Ext: warm well perfused  Assessment and Plan:   1. Endometriosis - Elagolix Sodium (ORILISSA) 150 MG TABS; Take 150 mg by mouth daily.  Dispense: 30 tablet; Refill: 12 Repeat LFT with rheum in June to assure safety of medication  2. Dysmenorrhea   Please refer to After Visit Summary for other counseling recommendations.   Return in about 3 months (around 11/13/2019) for Telehealth/Virtual health GYN visit, Discussed ORLISSA.  Caren Macadam, MD, MPH,  ABFM Attending [place] for [facility]

## 2019-08-22 ENCOUNTER — Other Ambulatory Visit: Payer: Self-pay | Admitting: Family Medicine

## 2019-08-22 DIAGNOSIS — Z1231 Encounter for screening mammogram for malignant neoplasm of breast: Secondary | ICD-10-CM

## 2019-08-29 ENCOUNTER — Ambulatory Visit
Admission: RE | Admit: 2019-08-29 | Discharge: 2019-08-29 | Disposition: A | Discharge status: Home / Self Care | Payer: BC Managed Care – PPO | Source: Ambulatory Visit

## 2019-08-29 ENCOUNTER — Other Ambulatory Visit: Payer: Self-pay

## 2019-08-29 DIAGNOSIS — Z1231 Encounter for screening mammogram for malignant neoplasm of breast: Secondary | ICD-10-CM

## 2019-09-04 ENCOUNTER — Encounter: Payer: Self-pay | Admitting: Family Medicine

## 2019-09-04 ENCOUNTER — Ambulatory Visit (INDEPENDENT_AMBULATORY_CARE_PROVIDER_SITE_OTHER): Payer: BC Managed Care – PPO | Admitting: Family Medicine

## 2019-09-04 ENCOUNTER — Other Ambulatory Visit: Payer: Self-pay

## 2019-09-04 DIAGNOSIS — F321 Major depressive disorder, single episode, moderate: Secondary | ICD-10-CM | POA: Diagnosis not present

## 2019-09-04 MED ORDER — FLUOXETINE HCL 20 MG PO TABS: 40.0000 mg | ORAL_TABLET | Freq: Every day | ORAL | 3 refills | Status: DC

## 2019-09-04 MED ORDER — TRAZODONE HCL 50 MG PO TABS: 25.0000 mg | ORAL_TABLET | Freq: Every evening | ORAL | 3 refills | Status: DC | PRN

## 2019-09-04 NOTE — Progress Notes (Signed)
VIRTUAL VISIT Due to national recommendations of social distancing due to [place] 19, a virtual visit is felt to be most appropriate for this patient at this time.   I connected with the patient on 09/04/19 at  3:20 PM EDT by virtual telehealth platform and verified that I am speaking with the correct person using two identifiers.   I discussed the limitations, risks, security and privacy concerns of performing an evaluation and management service by  virtual telehealth platform and the availability of in person appointments. I also discussed with the patient that there may be a patient responsible charge related to this service. The patient expressed understanding and agreed to proceed.  Patient location: Home Provider Location: [place] Participants: Eliezer Lofts and Arlie Solomons   Chief Complaint  Patient presents with  . Follow-up    Mood    History of Present Illness:  49  Year old female presents for  4 week follow up on mood.  At last OV on 3/4 her chronic dose of fluoxetine was increased to 40 mg daily.  She was also referred to counseling. Not able to see until 10/20/2019.   She feels that her mood is better somewhat. She feels 50% improvement especially in overall wellbeing.  No SE to higher dose.   She is still issue sleeping... falling asleep and staying asleep.  She has tried melatonin  Or Zquil.. has not helped much.  Depression screen [facility] 2/9 09/04/2019 08/07/2019 05/24/2018  Decreased Interest 2 3 0  Down, Depressed, Hopeless 2 2 0  PHQ - 2 Score 4 5 0  Altered sleeping 2 3 -  Tired, decreased energy 2 3 -  Change in appetite 0 3 -  Feeling bad or failure about yourself  0 2 -  Trouble concentrating 0 2 -  Moving slowly or fidgety/restless 0 0 -  Suicidal thoughts 0 0 -  PHQ-9 Score 8 18 -  Difficult doing work/chores Somewhat difficult Somewhat difficult -    COVID 19 screen No recent travel or known exposure to COVID19 The patient denies  respiratory symptoms of COVID 19 at this time.  The importance of social distancing was discussed today.   Review of Systems  Constitutional: Negative for chills and fever.  HENT: Negative for congestion and ear pain.   Eyes: Negative for pain and redness.  Respiratory: Negative for cough and shortness of breath.   Cardiovascular: Negative for chest pain, palpitations and leg swelling.  Gastrointestinal: Negative for abdominal pain, blood in stool, constipation, diarrhea, nausea and vomiting.  Genitourinary: Negative for dysuria.  Musculoskeletal: Negative for falls and myalgias.  Skin: Negative for rash.  Neurological: Negative for dizziness.  Psychiatric/Behavioral: Positive for depression. The patient has insomnia. The patient is not nervous/anxious.       Past Medical History:  Diagnosis Date  . 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 Sjogren's disease ([place])     reports that she has never smoked. She has never used smokeless tobacco. She reports that she does not drink alcohol or use drugs.   Current Outpatient Medications:  .  Ascorbic Acid (VITAMIN C) 1000 MG tablet, Take 1,000 mg by mouth daily., Disp: , Rfl:  .  Calcium Citrate 200 MG TABS, Take by mouth., Disp: , Rfl:  .  Cholecalciferol (D 1000) 1000 UNITS CHEW, Chew by mouth., Disp: , Rfl:  .  COD LIVER OIL PO, Take 1 tablet by mouth daily., Disp: , Rfl:  .  Elagolix Sodium (ORILISSA) 150  MG TABS, Take 150 mg by mouth daily., Disp: 30 tablet, Rfl: 12 .  ferrous sulfate (FER-IN-SOL) 75 (15 FE) MG/ML SOLN, Take 15 mg of iron by mouth daily., Disp: , Rfl:  .  FLUoxetine (PROZAC) 20 MG tablet, Take 1 tablet (20 mg total) by mouth daily., Disp: 90 tablet, Rfl: 0 .  fluticasone (FLONASE) 50 MCG/ACT nasal spray, Place 1 spray into both nostrils 2 (two) times daily., Disp: 16 g, Rfl: 6 .  hydroxychloroquine (PLAQUENIL) 200 MG tablet, Take by mouth 2 (two) times daily., Disp: , Rfl:  .  multivitamin-iron-minerals-folic acid (CENTRUM) chewable  tablet, Chew 2 tablets by mouth daily., Disp: , Rfl:  .  NIFEdipine (PROCARDIA-XL/NIFEDICAL-XL) 30 MG 24 hr tablet, TAKE 1 TABLET BY MOUTH EVERY DAY, Disp: 90 tablet, Rfl: 1   Observations/Objective: Height 5' 8.5" (1.74 m).  Physical Exam  Physical Exam Constitutional:      General: The patient is not in acute distress. Pulmonary:     Effort: Pulmonary effort is normal. No respiratory distress.  Neurological:     Mental Status: The patient is alert and oriented to person, place, and time.  Psychiatric:        Mood and Affect: Mood normal.        Behavior: Behavior normal.   Assessment and Plan   MDD (major depressive disorder), single episode, moderate (HCC) Improved control.. continue fluoxetine 40 mg daily  Given continued insomnia.Marland Kitchen will start trazodone prn in addition to fluoxetine.  Follow up prn.   I discussed the assessment and treatment plan with the patient. The patient was provided an opportunity to ask questions and all were answered. The patient agreed with the plan and demonstrated an understanding of the instructions.   The patient was advised to call back or seek an in-person evaluation if the symptoms worsen or if the condition fails to improve as anticipated.     Eliezer Lofts, MD

## 2019-09-04 NOTE — Assessment & Plan Note (Signed)
Improved control.. continue fluoxetine 40 mg daily  Given continued insomnia.Marland Kitchen will start trazodone prn in addition to fluoxetine.  Follow up prn.

## 2019-09-25 ENCOUNTER — Encounter: Payer: Self-pay | Admitting: Radiology

## 2019-09-28 DIAGNOSIS — B9789 Other viral agents as the cause of diseases classified elsewhere: Secondary | ICD-10-CM

## 2019-09-28 DIAGNOSIS — J329 Chronic sinusitis, unspecified: Secondary | ICD-10-CM

## 2019-09-28 MED ORDER — FLUTICASONE PROPIONATE 50 MCG/ACT NA SUSP: 1.0000 | Freq: Two times a day (BID) | NASAL | 5 refills | Status: DC

## 2019-09-29 ENCOUNTER — Other Ambulatory Visit: Payer: Self-pay | Admitting: Family Medicine

## 2019-10-20 ENCOUNTER — Ambulatory Visit: Payer: BC Managed Care – PPO | Admitting: Psychology

## 2019-10-28 ENCOUNTER — Ambulatory Visit: Payer: Self-pay | Admitting: Psychology

## 2019-10-29 ENCOUNTER — Ambulatory Visit (INDEPENDENT_AMBULATORY_CARE_PROVIDER_SITE_OTHER)
Admission: RE | Admit: 2019-10-29 | Discharge: 2019-10-29 | Disposition: A | Discharge status: Home / Self Care | Payer: BC Managed Care – PPO | Source: Ambulatory Visit

## 2019-10-29 ENCOUNTER — Other Ambulatory Visit: Payer: Self-pay

## 2019-10-29 DIAGNOSIS — J301 Allergic rhinitis due to pollen: Secondary | ICD-10-CM | POA: Diagnosis not present

## 2019-10-29 DIAGNOSIS — H1013 Acute atopic conjunctivitis, bilateral: Secondary | ICD-10-CM | POA: Diagnosis not present

## 2019-10-29 NOTE — ED Provider Notes (Signed)
Virtual Visit via Video Note:  Jessica Aguirre  initiated request for Telemedicine visit with [facility] team. I connected with [place]  on 10/29/2019 at 9:43 AM  for a synchronized telemedicine visit using a video enabled HIPPA compliant telemedicine application. I verified that I am speaking with [place]  using two identifiers. Sharion Balloon, NP  was physically located in a [facility] Urgent care site and [facility] was located at a different location.   The limitations of evaluation and management by telemedicine as well as the availability of in-person appointments were discussed. Patient was informed that she  may incur a bill ( including co-pay) for this virtual visit encounter. Promise L Venneman  expressed understanding and gave verbal consent to proceed with virtual visit.     History of Present Illness:Jessica Aguirre  is a 49 y.o. female presents for evaluation of nasal congestion, runny nose, and sinus pressure x 2 days.  Today her eyes are swollen and draining clear tears.  She denies fever, chills, sore throat, cough, SOB, abdominal pain, vomiting, diarrhea, rash, or other symptoms.  Treating symptoms with Flonase, Nettie Pot, and Tylenol.  She denies current pregnancy or breastfeeding.      Allergies  Allergen Reactions  . Other     Gluten causes severe Gi upset.     Past Medical History:  Diagnosis Date  . 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 Sjogren's disease ([place])      Social History   Tobacco Use  . Smoking status: Never Smoker  . Smokeless tobacco: Never Used  Substance Use Topics  . Alcohol use: No  . Drug use: No   ROS: as stated in HPI.  All other systems reviewed and negative.      Observations/Objective: Physical Exam  VITALS: Patient denies fever. GENERAL: Alert, appears well and in no acute distress. HEENT: Atraumatic.  Eyes tearing throughout exam; clear tears.  Nasal congestion and rhinorrhea noted.  NECK:  Normal movements of the head and neck. CARDIOPULMONARY: No increased WOB. Speaking in clear sentences. I:E ratio WNL.  MS: Moves all visible extremities without noticeable abnormality. PSYCH: Pleasant and cooperative, well-groomed. Speech normal rate and rhythm. Affect is appropriate. Insight and judgement are appropriate. Attention is focused, linear, and appropriate.  NEURO: CN grossly intact. Oriented as arrived to appointment on time with no prompting. Moves both UE equally.  SKIN: No obvious lesions, wounds, erythema, or cyanosis noted on face or hands.   Assessment and Plan:    ICD-10-CM   1. Seasonal allergic rhinitis due to pollen  J30.1   2. Allergic conjunctivitis of both eyes  H10.13        Follow Up Instructions: Treating with OTC Xyzal and Mucinex.  Discussed OTC allergy eyedrops also as needed.  Instructed patient to follow-up with her PCP or come here to be seen in person if her symptoms are not improving.  Patient agrees to plan of care.    I discussed the assessment and treatment plan with the patient. The patient was provided an opportunity to ask questions and all were answered. The patient agreed with the plan and demonstrated an understanding of the instructions.   The patient was advised to call back or seek an in-person evaluation if the symptoms worsen or if the condition fails to improve as anticipated.      Sharion Balloon, NP  10/29/2019 9:43 AM         [place],  Fredderick Phenix, NP 10/29/19 [phone]

## 2019-10-29 NOTE — Discharge Instructions (Addendum)
Take Xyzal and Mucinex as directed.    Follow up with your primary care provider or come here to be seen in person if your symptoms are not improving.

## 2019-10-30 ENCOUNTER — Telehealth: Payer: BC Managed Care – PPO | Admitting: Family Medicine

## 2019-10-30 MED ORDER — FLUOXETINE HCL 20 MG PO TABS: 40.0000 mg | ORAL_TABLET | Freq: Every day | ORAL | 1 refills | Status: DC

## 2020-01-12 ENCOUNTER — Other Ambulatory Visit: Payer: Self-pay

## 2020-01-13 MED ORDER — TRAZODONE HCL 50 MG PO TABS: 25.0000 mg | ORAL_TABLET | Freq: Every evening | ORAL | 1 refills | Status: DC | PRN

## 2020-03-22 ENCOUNTER — Encounter: Payer: Self-pay | Admitting: *Deleted

## 2020-08-18 DIAGNOSIS — B9789 Other viral agents as the cause of diseases classified elsewhere: Secondary | ICD-10-CM

## 2020-08-18 DIAGNOSIS — J329 Chronic sinusitis, unspecified: Secondary | ICD-10-CM

## 2020-08-18 MED ORDER — FLUTICASONE PROPIONATE 50 MCG/ACT NA SUSP: 1.0000 | Freq: Two times a day (BID) | NASAL | 0 refills | Status: DC

## 2020-10-06 ENCOUNTER — Other Ambulatory Visit: Payer: Self-pay | Admitting: Family Medicine

## 2020-10-07 NOTE — Telephone Encounter (Signed)
Spoke with patient scheduled CPE with fasting Labs

## 2020-10-07 NOTE — Telephone Encounter (Signed)
Please schedule CPE with fasting labs prior.  Please send back to me once scheduled to refill meds.

## 2020-11-06 ENCOUNTER — Other Ambulatory Visit: Payer: Self-pay | Admitting: Family Medicine

## 2020-11-06 DIAGNOSIS — B9789 Other viral agents as the cause of diseases classified elsewhere: Secondary | ICD-10-CM

## 2020-11-12 ENCOUNTER — Telehealth: Payer: Self-pay | Admitting: Family Medicine

## 2020-11-12 DIAGNOSIS — E7800 Pure hypercholesterolemia, unspecified: Secondary | ICD-10-CM

## 2020-11-12 NOTE — Telephone Encounter (Signed)
-----   Message from Ellamae Sia sent at 11/08/2020 11:36 AM EDT ----- Regarding: Lab orders for Friday, 6.24.22 Patient is scheduled for CPX labs, please order future labs, Thanks , Karna Christmas

## 2020-11-25 ENCOUNTER — Other Ambulatory Visit: Payer: Self-pay | Admitting: Family Medicine

## 2020-11-25 DIAGNOSIS — Z1231 Encounter for screening mammogram for malignant neoplasm of breast: Secondary | ICD-10-CM

## 2020-11-26 ENCOUNTER — Other Ambulatory Visit (INDEPENDENT_AMBULATORY_CARE_PROVIDER_SITE_OTHER): Payer: BC Managed Care – PPO

## 2020-11-26 ENCOUNTER — Other Ambulatory Visit: Payer: Self-pay

## 2020-11-26 DIAGNOSIS — E7800 Pure hypercholesterolemia, unspecified: Secondary | ICD-10-CM

## 2020-11-26 LAB — COMPREHENSIVE METABOLIC PANEL
ALT: 32 U/L (ref 0–35)
AST: 28 U/L (ref 0–37)
Albumin: 4.2 g/dL (ref 3.5–5.2)
Alkaline Phosphatase: 68 U/L (ref 39–117)
BUN: 12 mg/dL (ref 6–23)
CO2: 28 mEq/L (ref 19–32)
Calcium: 9 mg/dL (ref 8.4–10.5)
Chloride: 101 mEq/L (ref 96–112)
Creatinine, Ser: 0.74 mg/dL (ref 0.40–1.20)
GFR: 95.55 mL/min (ref >60.00)
Glucose, Bld: 84 mg/dL (ref 70–99)
Potassium: 3.9 mEq/L (ref 3.5–5.1)
Sodium: 136 mEq/L (ref 135–145)
Total Bilirubin: 0.5 mg/dL (ref 0.2–1.2)
Total Protein: 7.4 g/dL (ref 6.0–8.3)

## 2020-11-26 LAB — LIPID PANEL
Cholesterol: 237 mg/dL — ABNORMAL HIGH (ref 0–200)
HDL: 79.2 mg/dL (ref >39.00)
LDL Cholesterol: 145 mg/dL — ABNORMAL HIGH (ref 0–99)
NonHDL: 157.3
Total CHOL/HDL Ratio: 3
Triglycerides: 63 mg/dL (ref 0.0–149.0)
VLDL: 12.6 mg/dL (ref 0.0–40.0)

## 2020-11-26 NOTE — Progress Notes (Signed)
No critical labs need to be addressed urgently. We will discuss labs in detail at upcoming office visit.   

## 2020-12-03 ENCOUNTER — Encounter: Payer: BC Managed Care – PPO | Admitting: Family Medicine

## 2021-01-18 ENCOUNTER — Other Ambulatory Visit: Payer: Self-pay

## 2021-01-18 ENCOUNTER — Ambulatory Visit (INDEPENDENT_AMBULATORY_CARE_PROVIDER_SITE_OTHER): Payer: BC Managed Care – PPO | Admitting: Family Medicine

## 2021-01-18 ENCOUNTER — Encounter: Payer: Self-pay | Admitting: Family Medicine

## 2021-01-18 VITALS — BP 120/80 | HR 61 | Temp 98.1°F | Ht 68.25 in | Wt 281.2 lb

## 2021-01-18 DIAGNOSIS — Z0000 Encounter for general adult medical examination without abnormal findings: Secondary | ICD-10-CM

## 2021-01-18 DIAGNOSIS — Z1211 Encounter for screening for malignant neoplasm of colon: Secondary | ICD-10-CM

## 2021-01-18 NOTE — Patient Instructions (Addendum)
Keep working on heart healthy diet, keep up with regular exercise.  Due to there being a large influx of referrals the GI offices are currently scheduling appointments as far out as late Aug/Sept 2022. They have asked that you call their office regarding your referral and scheduling needs. Otherwise, they will reach out to you as soon as they are able. They are working diligently to get patients called and scheduled as soon as possible.  Colonoscopies will be called according to date/time of the referral entry as these are not of urgent need.    [facility]  [phone] [facility]  [phone] [facility]  [phone]  [facility] can be considered as well. They may be able to book sooner appointments. You can reach [facility] at [phone].   Thank you for your patience and please let us know if you have any questions or concerns.    Thank You!  - [place] Referrals

## 2021-01-18 NOTE — Progress Notes (Signed)
Patient ID: Jessica Aguirre, female    DOB: 1970-06-23, 50 y.o.   MRN: [id number]  This visit was conducted in person.  BP 120/80   Pulse 61   Temp 98.1 F (36.7 C) (Temporal)   Ht 5' 8.25" (1.734 m)   Wt 281 lb 4 oz (127.6 kg)   LMP 01/08/2021   SpO2 100%   BMI 42.45 kg/m    CC:  Chief Complaint  Patient presents with   Annual Exam    Subjective:   HPI: Jessica Aguirre is a 50 y.o. female presenting on 01/18/2021 for Annual Exam  Elevated Cholesterol:  Lab Results  Component Value Date   CHOL 237 (H) 11/26/2020   HDL 79.20 11/26/2020   LDLCALC 145 (H) 11/26/2020   LDLDIRECT 142.3 01/09/2013   TRIG 63.0 11/26/2020   CHOLHDL 3 11/26/2020  The 10-year ASCVD risk score Mikey Bussing DC Jr., et al., 2013) is: 0.7%   Values used to calculate the score:     Age: 36 years     Sex: Female     Is Non-Hispanic African American: No     Diabetic: No     Tobacco smoker: No     Systolic Blood Pressure: 123456 mmHg     Is BP treated: No     HDL Cholesterol: 79.2 mg/dL     Total Cholesterol: 237 mg/dL   Using medications without problems: Muscle aches:  Diet compliance: healthy eating habits Exercise: daily 3 miles Other complaints:  MDD:   Improved overall on trazodone and fluoxetine PHQ9:2.   Followed by rheumatology for Sjogren's and Raynaud's  Has been seeing ORTHO  Reviewed note 12/08/2020 improved with steroid injection.     Relevant past medical, surgical, family and social history reviewed and updated as indicated. Interim medical history since our last visit reviewed. Allergies and medications reviewed and updated. Outpatient Medications Prior to Visit  Medication Sig Dispense Refill   Ascorbic Acid (VITAMIN C) 1000 MG tablet Take 1,000 mg by mouth daily.     Calcium Citrate 200 MG TABS Take by mouth.     Cholecalciferol 25 MCG (1000 UT) CHEW Chew by mouth.     COD LIVER OIL PO Take 1 tablet by mouth daily.     Elagolix Sodium (ORILISSA) 150 MG TABS Take 150  mg by mouth daily. 30 tablet 12   ferrous sulfate (FER-IN-SOL) 75 (15 FE) MG/ML SOLN Take 15 mg of iron by mouth daily.     FLUoxetine (PROZAC) 20 MG tablet TAKE 2 TABLETS BY MOUTH EVERY DAY 180 tablet 0   fluticasone (FLONASE) 50 MCG/ACT nasal spray PLACE 1 SPRAY INTO BOTH NOSTRILS 2 (TWO) TIMES DAILY 48 mL 0   hydroxychloroquine (PLAQUENIL) 200 MG tablet Take by mouth 2 (two) times daily.     multivitamin-iron-minerals-folic acid (CENTRUM) chewable tablet Chew 2 tablets by mouth daily.     naproxen (NAPROSYN) 500 MG tablet Take 500 mg by mouth 2 (two) times daily with a meal.     NIFEdipine (PROCARDIA-XL/NIFEDICAL-XL) 30 MG 24 hr tablet TAKE 1 TABLET BY MOUTH EVERY DAY 90 tablet 1   traZODone (DESYREL) 50 MG tablet Take 0.5-1 tablets (25-50 mg total) by mouth at bedtime as needed. for sleep 90 tablet 1   pilocarpine (SALAGEN) 5 MG tablet Take 5 mg by mouth 3 (three) times daily.     No facility-administered medications prior to visit.     Per HPI unless specifically indicated in ROS section below Review of  Systems  Constitutional:  Negative for fatigue and fever.  HENT:  Negative for congestion and ear pain.   Eyes:  Negative for pain.  Respiratory:  Negative for cough, chest tightness and shortness of breath.   Cardiovascular:  Negative for chest pain, palpitations and leg swelling.  Gastrointestinal:  Negative for abdominal pain.  Genitourinary:  Negative for dysuria and vaginal bleeding.  Musculoskeletal:  Negative for back pain.  Neurological:  Negative for syncope, light-headedness and headaches.  Psychiatric/Behavioral:  Negative for dysphoric mood.   Objective:  BP 120/80   Pulse 61   Temp 98.1 F (36.7 C) (Temporal)   Ht 5' 8.25" (1.734 m)   Wt 281 lb 4 oz (127.6 kg)   LMP 01/08/2021   SpO2 100%   BMI 42.45 kg/m   Wt Readings from Last 3 Encounters:  01/18/21 281 lb 4 oz (127.6 kg)  08/13/19 276 lb 3.2 oz (125.3 kg)  08/07/19 280 lb (127 kg)      Physical  Exam Constitutional:      General: She is not in acute distress.    Appearance: Normal appearance. She is well-developed. She is obese. She is not ill-appearing or toxic-appearing.  HENT:     Head: Normocephalic.     Right Ear: Hearing, tympanic membrane, ear canal and external ear normal.     Left Ear: Hearing, tympanic membrane, ear canal and external ear normal.     Nose: Nose normal.  Eyes:     General: Lids are normal. Lids are everted, no foreign bodies appreciated.     Conjunctiva/sclera: Conjunctivae normal.     Pupils: Pupils are equal, round, and reactive to light.  Neck:     Thyroid: No thyroid mass or thyromegaly.     Vascular: No carotid bruit.     Trachea: Trachea normal.  Cardiovascular:     Rate and Rhythm: Normal rate and regular rhythm.     Heart sounds: Normal heart sounds, S1 normal and S2 normal. No murmur heard.   No gallop.  Pulmonary:     Effort: Pulmonary effort is normal. No respiratory distress.     Breath sounds: Normal breath sounds. No wheezing, rhonchi or rales.  Abdominal:     General: Bowel sounds are normal. There is no distension or abdominal bruit.     Palpations: Abdomen is soft. There is no fluid wave or mass.     Tenderness: There is no abdominal tenderness. There is no guarding or rebound.     Hernia: No hernia is present.  Musculoskeletal:     Cervical back: Normal range of motion and neck supple.  Lymphadenopathy:     Cervical: No cervical adenopathy.  Skin:    General: Skin is warm and dry.     Findings: No rash.  Neurological:     Mental Status: She is alert.     Cranial Nerves: No cranial nerve deficit.     Sensory: No sensory deficit.  Psychiatric:        Mood and Affect: Mood is not anxious or depressed.        Speech: Speech normal.        Behavior: Behavior normal. Behavior is cooperative.        Judgment: Judgment normal.      Results for orders placed or performed in visit on 11/26/20  Comprehensive metabolic panel   Result Value Ref Range   Sodium 136 135 - 145 mEq/L   Potassium 3.9 3.5 - 5.1 mEq/L  Chloride 101 96 - 112 mEq/L   CO2 28 19 - 32 mEq/L   Glucose, Bld 84 70 - 99 mg/dL   BUN 12 6 - 23 mg/dL   Creatinine, Ser 0.74 0.40 - 1.20 mg/dL   Total Bilirubin 0.5 0.2 - 1.2 mg/dL   Alkaline Phosphatase 68 39 - 117 U/L   AST 28 0 - 37 U/L   ALT 32 0 - 35 U/L   Total Protein 7.4 6.0 - 8.3 g/dL   Albumin 4.2 3.5 - 5.2 g/dL   GFR 95.55 >60.00 mL/min   Calcium 9.0 8.4 - 10.5 mg/dL  Lipid panel  Result Value Ref Range   Cholesterol 237 (H) 0 - 200 mg/dL   Triglycerides 63.0 0.0 - 149.0 mg/dL   HDL 79.20 >39.00 mg/dL   VLDL 12.6 0.0 - 40.0 mg/dL   LDL Cholesterol 145 (H) 0 - 99 mg/dL   Total CHOL/HDL Ratio 3    NonHDL 157.30     This visit occurred during the SARS-CoV-2 public health emergency.  Safety protocols were in place, including screening questions prior to the visit, additional usage of staff PPE, and extensive cleaning of exam room while observing appropriate contact time as indicated for disinfecting solutions.   COVID 19 screen:  No recent travel or known exposure to COVID19 The patient denies respiratory symptoms of COVID 19 at this time. The importance of social distancing was discussed today.   Assessment and Plan   The patient's preventative maintenance and recommended screening tests for an annual wellness exam were reviewed in full today. Brought up to date unless services declined.  Counselled on the importance of diet, exercise, and its role in overall health and mortality. The patient's FH and SH was reviewed, including their home life, tobacco status, and drug and alcohol status.   pap/DVE: per GYN, nml pap 2019, plane repeat 5 years.  Mammo:  08/2019 nml.. has set up this week.  Vaccines: Uptodate with pneumonia vaccine given immunocomp, TDAP, flu uptodate Nonsmoker Colon: no early  family colon cancer... due now after age 69.  No desire for STD testing.  Wine 1  glass nightly.  Hep c : due  Orders Placed This Encounter  Procedures   Ambulatory referral to Gastroenterology    Referral Priority:   Routine    Referral Type:   Consultation    Referral Reason:   Specialty Services Required    Number of Visits Requested:   1    Eliezer Lofts, MD

## 2021-01-20 ENCOUNTER — Other Ambulatory Visit: Payer: Self-pay

## 2021-01-20 ENCOUNTER — Ambulatory Visit
Admission: RE | Admit: 2021-01-20 | Discharge: 2021-01-20 | Disposition: A | Discharge status: Home / Self Care | Payer: BC Managed Care – PPO | Source: Ambulatory Visit

## 2021-01-20 ENCOUNTER — Other Ambulatory Visit: Payer: Self-pay | Admitting: Family Medicine

## 2021-01-20 DIAGNOSIS — Z1231 Encounter for screening mammogram for malignant neoplasm of breast: Secondary | ICD-10-CM

## 2021-01-25 ENCOUNTER — Encounter: Payer: Self-pay | Admitting: Gastroenterology

## 2021-01-25 ENCOUNTER — Other Ambulatory Visit: Payer: Self-pay | Admitting: Family Medicine

## 2021-02-08 ENCOUNTER — Ambulatory Visit (AMBULATORY_SURGERY_CENTER): Payer: BC Managed Care – PPO | Admitting: *Deleted

## 2021-02-08 ENCOUNTER — Other Ambulatory Visit: Payer: Self-pay

## 2021-02-08 VITALS — Ht 68.25 in | Wt 281.0 lb

## 2021-02-08 DIAGNOSIS — Z1211 Encounter for screening for malignant neoplasm of colon: Secondary | ICD-10-CM

## 2021-02-08 MED ORDER — PEG 3350-KCL-NA BICARB-NACL 420 G PO SOLR: 4000.0000 mL | Freq: Once | ORAL | 0 refills | Status: AC

## 2021-02-08 NOTE — Progress Notes (Signed)
Patient's pre-visit was done today over the phone with the patient due to COVID-19 pandemic. Name,DOB and address verified. Insurance verified. Patient denies any allergies to Eggs and Soy. Patient denies any problems with anesthesia/sedation. Patient denies taking diet pills or blood thinners. No home Oxygen. Packet of Prep instructions mailed to patient including a copy of a consent form-pt is aware. Patient understands to call us back with any questions or concerns. Patient is aware of our care-partner policy and 0000000 safety protocol.   The patient is COVID-19 vaccinated.  Emmi sent to mychart.

## 2021-02-09 ENCOUNTER — Encounter: Payer: Self-pay | Admitting: Gastroenterology

## 2021-02-22 ENCOUNTER — Encounter: Payer: Self-pay | Admitting: Gastroenterology

## 2021-02-22 ENCOUNTER — Other Ambulatory Visit: Payer: Self-pay

## 2021-02-22 ENCOUNTER — Ambulatory Visit (AMBULATORY_SURGERY_CENTER): Payer: BC Managed Care – PPO | Admitting: Gastroenterology

## 2021-02-22 VITALS — BP 115/70 | HR 73 | Temp 98.9°F | Resp 23 | Ht 68.2 in | Wt 281.0 lb

## 2021-02-22 DIAGNOSIS — K635 Polyp of colon: Secondary | ICD-10-CM

## 2021-02-22 DIAGNOSIS — Z1211 Encounter for screening for malignant neoplasm of colon: Secondary | ICD-10-CM | POA: Diagnosis present

## 2021-02-22 DIAGNOSIS — D124 Benign neoplasm of descending colon: Secondary | ICD-10-CM

## 2021-02-22 MED ORDER — SODIUM CHLORIDE 0.9 % IV SOLN: 500.0000 mL | Freq: Once | INTRAVENOUS | Status: DC

## 2021-02-22 NOTE — Progress Notes (Signed)
Called to room to assist during endoscopic procedure.  Patient ID and intended procedure confirmed with present staff. Received instructions for my participation in the procedure from the performing physician.  

## 2021-02-22 NOTE — Progress Notes (Signed)
Report to PACU, RN, vss, BBS= Clear.  

## 2021-02-22 NOTE — Op Note (Signed)
[place] Patient Name: Jessica Aguirre Procedure Date: 02/22/2021 8:00 AM MRN: [id number] Endoscopist: Ladene Artist , MD Age: 50 Referring MD:  Date of Birth: 05-19-1971 Gender: Female Account #: [id number] Procedure:                Colonoscopy Indications:              Screening for colorectal malignant neoplasm Medicines:                Monitored Anesthesia Care Procedure:                Pre-Anesthesia Assessment:                           - Prior to the procedure, a History and Physical                            was performed, and patient medications and                            allergies were reviewed. The patient's tolerance of                            previous anesthesia was also reviewed. The risks                            and benefits of the procedure and the sedation                            options and risks were discussed with the patient.                            All questions were answered, and informed consent                            was obtained. Prior Anticoagulants: The patient has                            taken no previous anticoagulant or antiplatelet                            agents. ASA Grade Assessment: III - A patient with                            severe systemic disease. After reviewing the risks                            and benefits, the patient was deemed in                            satisfactory condition to undergo the procedure.                           After obtaining informed consent, the colonoscope  was passed under direct vision. Throughout the                            procedure, the patient's blood pressure, pulse, and                            oxygen saturations were monitored continuously. The                            Olympus CF-HQ190L ([id number]) Colonoscope was                            introduced through the anus and advanced to the the                            cecum,  identified by appendiceal orifice and                            ileocecal valve. The ileocecal valve, appendiceal                            orifice, and rectum were photographed. The quality                            of the bowel preparation was good after moderate                            lavage, suction. The colonoscopy was performed                            without difficulty. The patient tolerated the                            procedure well. Scope In: 8:08:30 AM Scope Out: 8:23:23 AM Scope Withdrawal Time: 0 hours 12 minutes 11 seconds  Total Procedure Duration: 0 hours 14 minutes 53 seconds  Findings:                 The perianal and digital rectal examinations were                            normal.                           A 6 mm polyp was found in the descending colon. The                            polyp was sessile. The polyp was removed with a                            cold snare. Resection and retrieval were complete.                           The exam was otherwise without abnormality on  direct and retroflexion views. Complications:            No immediate complications. Estimated blood loss:                            None. Estimated Blood Loss:     Estimated blood loss: none. Impression:               - One 6 mm polyp in the descending colon, removed                            with a cold snare. Resected and retrieved.                           - The examination was otherwise normal on direct                            and retroflexion views. Recommendation:           - Repeat colonoscopy after studies are complete for                            surveillance based on pathology results.                           - Patient has a contact number available for                            emergencies. The signs and symptoms of potential                            delayed complications were discussed with the                             patient. Return to normal activities tomorrow.                            Written discharge instructions were provided to the                            patient.                           - Resume previous diet.                           - Continue present medications.                           - Await pathology results. Ladene Artist, MD 02/22/2021 8:26:01 AM This report has been signed electronically.

## 2021-02-22 NOTE — Progress Notes (Signed)
History & Physical  Primary Care Physician:  Jinny Sanders, MD Primary Gastroenterologist: Jerilynn Mages. Fuller Plan, MD  CHIEF COMPLAINT:  CRC screening  HPI: Jessica Aguirre is a 50 y.o. female who presents for colonoscopy, CRC screening for average risk colonoscopy.  This is her first colonoscopy.   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 Authorizing Provider  Ascorbic Acid (VITAMIN C) 1000 MG tablet Take 1,000 mg by mouth daily.   Yes [provider]  Calcium Citrate 200 MG TABS Take by mouth.   Yes [provider]  Cholecalciferol 25 MCG (1000 UT) CHEW Chew by mouth.   Yes [provider]  COD LIVER OIL PO Take 1 tablet by mouth daily.   Yes [provider]  ferrous sulfate (FER-IN-SOL) 75 (15 FE) MG/ML SOLN Take 15 mg of iron by mouth daily.   Yes [provider]  FLUoxetine (PROZAC) 20 MG tablet TAKE 2 TABLETS BY MOUTH EVERY DAY 01/25/21  Yes Bedsole, Amy E, MD  fluticasone (FLONASE) 50 MCG/ACT nasal spray PLACE 1 SPRAY INTO BOTH NOSTRILS 2 (TWO) TIMES DAILY 11/07/20  Yes Bedsole, Amy E, MD  hydroxychloroquine (PLAQUENIL) 200 MG tablet Take by mouth 2 (two) times daily.   Yes [provider]  MAGNESIUM PO Take 500 mg by mouth daily.   Yes [provider]  multivitamin-iron-minerals-folic acid (CENTRUM) chewable tablet Chew 2 tablets by mouth daily.   Yes [provider]  naproxen (NAPROSYN) 500 MG tablet Take 500 mg by mouth 2 (two) times daily with a meal.   Yes [provider]  NIFEdipine (PROCARDIA-XL/NIFEDICAL-XL) 30 MG 24 hr tablet TAKE 1 TABLET BY MOUTH EVERY DAY 08/22/18  Yes  Bedsole, Amy E, MD  pilocarpine (SALAGEN) 5 MG tablet Take 5 mg by mouth 3 (three) times daily. 11/18/20  Yes [provider]  traZODone (DESYREL) 50 MG tablet TAKE 0.5-1 TABLETS (25-50 MG TOTAL) BY MOUTH AT BEDTIME AS NEEDED. FOR SLEEP 01/20/21  Yes Bedsole, Amy E, MD  Elagolix Sodium (ORILISSA) 150 MG TABS Take 150 mg by mouth daily. Patient not taking: Reported on 02/22/2021 08/13/19   Caren Macadam, MD    Current Outpatient Medications  Medication Sig Dispense Refill   Ascorbic Acid (VITAMIN C) 1000 MG tablet Take 1,000 mg by mouth daily.     Calcium Citrate 200 MG TABS Take by mouth.     Cholecalciferol 25 MCG (1000 UT) CHEW Chew by mouth.     COD LIVER OIL PO Take 1 tablet by mouth daily.     ferrous sulfate (FER-IN-SOL) 75 (15 FE) MG/ML SOLN Take 15 mg of iron by mouth daily.     FLUoxetine (PROZAC) 20 MG tablet TAKE 2 TABLETS BY MOUTH EVERY DAY 180 tablet 1   fluticasone (FLONASE) 50 MCG/ACT nasal spray PLACE 1 SPRAY INTO BOTH NOSTRILS 2 (TWO) TIMES DAILY 48 mL 0   hydroxychloroquine (PLAQUENIL) 200 MG tablet Take by mouth 2 (two) times daily.     MAGNESIUM PO Take 500 mg by mouth daily.     multivitamin-iron-minerals-folic  acid (CENTRUM) chewable tablet Chew 2 tablets by mouth daily.     naproxen (NAPROSYN) 500 MG tablet Take 500 mg by mouth 2 (two) times daily with a meal.     NIFEdipine (PROCARDIA-XL/NIFEDICAL-XL) 30 MG 24 hr tablet TAKE 1 TABLET BY MOUTH EVERY DAY 90 tablet 1   pilocarpine (SALAGEN) 5 MG tablet Take 5 mg by mouth 3 (three) times daily.     traZODone (DESYREL) 50 MG tablet TAKE 0.5-1 TABLETS (25-50 MG TOTAL) BY MOUTH AT BEDTIME AS NEEDED. FOR SLEEP 90 tablet 1   Elagolix Sodium (ORILISSA) 150 MG TABS Take 150 mg by mouth daily. (Patient not taking: Reported on 02/22/2021) 30 tablet 12   Current Facility-Administered Medications  Medication Dose Route Frequency Provider Last Rate Last Admin   0.9 %  sodium chloride infusion  500 mL Intravenous Once  Ladene Artist, MD        Allergies as of 02/22/2021 - Review Complete 02/22/2021  Allergen Reaction Noted   Other  05/12/2013    Family History  Problem Relation Age of Onset   Sudden death Father    Obesity Father        patient was over 800 lbs   Hypertension Father    Diabetes Father    Diabetes Maternal Grandmother    Hypertension Maternal Grandmother    Hypertension Maternal Grandfather    Diabetes Maternal Grandfather    Diabetes Paternal Grandmother    Hypertension Paternal Grandmother    Colon cancer Neg Hx    Esophageal cancer Neg Hx    Rectal cancer Neg Hx    Stomach cancer Neg Hx    Colon polyps Neg Hx     Social History   Socioeconomic History   Marital status: Married    Spouse name: Not on file   Number of children: 3   Years of education: Not on file   Highest education level: Not on file  Occupational History   Not on file  Tobacco Use   Smoking status: Never   Smokeless tobacco: Never  Vaping Use   Vaping Use: Never used  Substance and Sexual Activity   Alcohol use: Yes    Alcohol/week: 7.0 standard drinks    Types: 7 Standard drinks or equivalent per week   Drug use: No   Sexual activity: Not on file  Other Topics Concern   Not on file  Social History Narrative   Occupation: medical coder   3 children healthy   Regular Exercise- yes, 6 days a week , 50 min   Diet: fruit and veggies,water,calcium   Social Determinants of Health   Financial Resource Strain: Not on file  Food Insecurity: Not on file  Transportation Needs: Not on file  Physical Activity: Not on file  Stress: Not on file  Social Connections: Not on file  Intimate Partner Violence: Not on file    Review of Systems:  All systems reviewed an negative except where noted in HPI.  Gen: Denies any fever, chills, sweats, anorexia, fatigue, weakness, malaise, weight loss, and sleep disorder CV: Denies chest pain, angina, palpitations, syncope, orthopnea, PND, peripheral  edema, and claudication. Resp: Denies dyspnea at rest, dyspnea with exercise, cough, sputum, wheezing, coughing up blood, and pleurisy. GI: Denies vomiting blood, jaundice, and fecal incontinence.   Denies dysphagia or odynophagia. GU : Denies urinary burning, blood in urine, urinary frequency, urinary hesitancy, nocturnal urination, and urinary incontinence. MS: Denies joint pain, limitation of movement, and swelling, stiffness, low back pain,  extremity pain. Denies muscle weakness, cramps, atrophy.  Derm: Denies rash, itching, dry skin, hives, moles, warts, or unhealing ulcers.  Psych: Denies depression, anxiety, memory loss, suicidal ideation, hallucinations, paranoia, and confusion. Heme: Denies bruising, bleeding, and enlarged lymph nodes. Neuro:  Denies any headaches, dizziness, paresthesias. Endo:  Denies any problems with DM, thyroid, adrenal function.   Physical Exam: General:  Alert, well-developed, in NAD Head:  Normocephalic and atraumatic. Eyes:  Sclera clear, no icterus.   Conjunctiva pink. Ears:  Normal auditory acuity. Mouth:  No deformity or lesions.  Neck:  Supple; no masses . Lungs:  Clear throughout to auscultation.   No wheezes, crackles, or rhonchi. No acute distress. Heart:  Regular rate and rhythm; no murmurs. Abdomen:  Soft, nondistended, nontender. No masses, hepatomegaly. No obvious masses.  Normal bowel .    Rectal:  Deferred   Msk:  Symmetrical without gross deformities.. Pulses:  Normal pulses noted. Extremities:  Without edema. Neurologic:  Alert and  oriented x4;  grossly normal neurologically. Skin:  Intact without significant lesions or rashes. Cervical Nodes:  No significant cervical adenopathy. Psych:  Alert and cooperative. Normal mood and affect.   Impression / Plan:   CRC screening, average risk for colonoscopy.    This patient is appropriate for endoscopic procedures in the ambulatory setting.    Pricilla Riffle. Fuller Plan  02/22/2021, 8:04  AM

## 2021-02-22 NOTE — Patient Instructions (Addendum)
Handout was given to your care partner on polyps. You may resume your current medications today. Await biopsy results.  May take 1-3 weeks to receive pathology results. Please call if any questions or concerns.    YOU HAD AN ENDOSCOPIC PROCEDURE TODAY AT THE [facility]:   Refer to the procedure report that was given to you for any specific questions about what was found during the examination.  If the procedure report does not answer your questions, please call your gastroenterologist to clarify.  If you requested that your care partner not be given the details of your procedure findings, then the procedure report has been included in a sealed envelope for you to review at your convenience later.  YOU SHOULD EXPECT: Some feelings of bloating in the abdomen. Passage of more gas than usual.  Walking can help get rid of the air that was put into your GI tract during the procedure and reduce the bloating. If you had a lower endoscopy (such as a colonoscopy or flexible sigmoidoscopy) you may notice spotting of blood in your stool or on the toilet paper. If you underwent a bowel prep for your procedure, you may not have a normal bowel movement for a few days.  Please Note:  You might notice some irritation and congestion in your nose or some drainage.  This is from the oxygen used during your procedure.  There is no need for concern and it should clear up in a day or so.  SYMPTOMS TO REPORT IMMEDIATELY:  Following lower endoscopy (colonoscopy or flexible sigmoidoscopy):  Excessive amounts of blood in the stool  Significant tenderness or worsening of abdominal pains  Swelling of the abdomen that is new, acute  Fever of 100F or higher    For urgent or emergent issues, a gastroenterologist can be reached at any hour by calling [phone]. Do not use MyChart messaging for urgent concerns.    DIET:  We do recommend a small meal at first, but then you may proceed to your regular  diet.  Drink plenty of fluids but you should avoid alcoholic beverages for 24 hours.  ACTIVITY:  You should plan to take it easy for the rest of today and you should NOT DRIVE or use heavy machinery until tomorrow (because of the sedation medicines used during the test).    FOLLOW UP: Our staff will call the number listed on your records 48-72 hours following your procedure to check on you and address any questions or concerns that you may have regarding the information given to you following your procedure. If we do not reach you, we will leave a message.  We will attempt to reach you two times.  During this call, we will ask if you have developed any symptoms of COVID 19. If you develop any symptoms (ie: fever, flu-like symptoms, shortness of breath, cough etc.) before then, please call [phone].  If you test positive for Covid 19 in the 2 weeks post procedure, please call and report this information to us.    If any biopsies were taken you will be contacted by phone or by letter within the next 1-3 weeks.  Please call us at [phone] if you have not heard about the biopsies in 3 weeks.    SIGNATURES/CONFIDENTIALITY: You and/or your care partner have signed paperwork which will be entered into your electronic medical record.  These signatures attest to the fact that that the information above on your After Visit Summary has been   has been reviewed and is understood.  Full responsibility of the confidentiality of this discharge information lies with you and/or your care-partner.  

## 2021-02-22 NOTE — Progress Notes (Signed)
Vitals not carrying over

## 2021-02-22 NOTE — Progress Notes (Signed)
Pt's states no medical or surgical changes since previsit or office visit. VS assessed by N.C ?

## 2021-02-22 NOTE — Progress Notes (Signed)
No problems noted in the recovery room. maw 

## 2021-02-24 ENCOUNTER — Telehealth: Payer: Self-pay

## 2021-02-24 NOTE — Telephone Encounter (Signed)
  Follow up Call-  Call back number 02/22/2021  Post procedure Call Back phone  # [phone]  Permission to leave phone message Yes  Some recent data might be hidden     Patient questions:  Do you have a fever, pain , or abdominal swelling? No. Pain Score  0 *  Have you tolerated food without any problems? Yes.    Have you been able to return to your normal activities? Yes.    Do you have any questions about your discharge instructions: Diet   No. Medications  No. Follow up visit  No.  Do you have questions or concerns about your Care? No.  Actions: * If pain score is 4 or above: No action needed, pain <4. Have you developed a fever since your procedure? no  2.   Have you had an respiratory symptoms (SOB or cough) since your procedure? no  3.   Have you tested positive for COVID 19 since your procedure no  4.   Have you had any family members/close contacts diagnosed with the COVID 19 since your procedure?  no   If yes to any of these questions please route to Joylene John, RN and Joella Prince, RN

## 2021-03-08 ENCOUNTER — Encounter: Payer: Self-pay | Admitting: Gastroenterology

## 2021-07-10 ENCOUNTER — Other Ambulatory Visit: Payer: Self-pay | Admitting: Family Medicine

## 2021-10-14 ENCOUNTER — Other Ambulatory Visit: Payer: Self-pay | Admitting: Family Medicine

## 2021-10-14 DIAGNOSIS — B9789 Other viral agents as the cause of diseases classified elsewhere: Secondary | ICD-10-CM

## 2021-10-17 ENCOUNTER — Encounter: Payer: Self-pay | Admitting: Family Medicine

## 2021-10-17 ENCOUNTER — Ambulatory Visit: Payer: BC Managed Care – PPO | Admitting: Family Medicine

## 2021-10-17 VITALS — BP 112/78 | HR 78 | Temp 98.7°F | Ht 68.25 in | Wt 271.4 lb

## 2021-10-17 DIAGNOSIS — R051 Acute cough: Secondary | ICD-10-CM

## 2021-10-17 DIAGNOSIS — R509 Fever, unspecified: Secondary | ICD-10-CM

## 2021-10-17 DIAGNOSIS — U071 COVID-19: Secondary | ICD-10-CM

## 2021-10-17 LAB — POC COVID19 BINAXNOW: SARS Coronavirus 2 Ag: POSITIVE — AB

## 2021-10-17 LAB — POC INFLUENZA A&B (BINAX/QUICKVUE)
Influenza A, POC: NEGATIVE
Influenza B, POC: NEGATIVE

## 2021-10-17 MED ORDER — MOLNUPIRAVIR EUA 200MG CAPSULE: 4.0000 | ORAL_CAPSULE | Freq: Two times a day (BID) | ORAL | 0 refills | Status: AC

## 2021-10-17 MED ORDER — HYDROCOD POLI-CHLORPHE POLI ER 10-8 MG/5ML PO SUER: 5.0000 mL | Freq: Two times a day (BID) | ORAL | 0 refills | Status: DC | PRN

## 2021-10-17 NOTE — Progress Notes (Signed)
? ? ?Steven Veazie T. Bretta Fees, MD, [facility] ?Therapist, music at [facility] ?[place] ?[place], [address] ? ?Phone: [phone]  FAX: [phone] ? ?Jessica Aguirre - 51 y.o. female  MRN [id number]  Date of Birth: 1971-03-27 ? ?Date: 10/17/2021  PCP: Jinny Sanders, MD  Referral: Jinny Sanders, MD ? ?Chief Complaint  ?Patient presents with  ? Cough  ?  Started Wednesday night ?Negative Covid Home Test on Friday  ? Nasal Congestion  ? Chills  ? Fever  ? ?Subjective:  ? ?Jessica Aguirre is a 51 y.o. very pleasant female patient with Body mass index is 40.96 kg/m?. who presents with the following: ? ?Fever, chills, coughing stuffy nose.  Aching all over.  Husband has been sick - sick on Monday.  Then he started to feel better.    ? ?102.4 Temp last night.  ? ?She does not have any known COVID-19 or influenza contacts, however her husband has been sick.  She has diffuse polyarthralgia, myalgia, severe sore throat, fever, chills.  She also has had a profuse cough. ? ?No nausea, vomiting, diarrhea or any sensory changes. ? ?Review of Systems is noted in the HPI, as appropriate ? ?Objective:  ? ?BP 112/78   Pulse 78   Temp 98.7 ?F (37.1 ?C) (Oral)   Ht 5' 8.25" (1.734 m)   Wt 271 lb 6 oz (123.1 kg)   SpO2 100%   BMI 40.96 kg/m?  ? ? ?Gen: WDWN, NAD. Globally Non-toxic ?HEENT: Throat clear, w/o exudate, R TM clear, L TM - good landmarks, No fluid present. rhinnorhea.  MMM ?Frontal sinuses: NT ?Max sinuses: NT ?NECK: Anterior cervical  LAD is absent ?CV: RRR, No M/G/R, cap refill <2 sec ?PULM: Breathing comfortably in no respiratory distress. no wheezing, crackles, rhonchi  ? ?Laboratory and Imaging Data: ?Results for orders placed or performed in visit on 10/17/21  ?[place] COVID-19  ?Result Value Ref Range  ? SARS Coronavirus 2 Ag Positive (A) Negative  ?POC Influenza A&B (Binax test)  ?Result Value Ref Range  ? Influenza A, POC Negative Negative  ? Influenza B, POC Negative  Negative  ?  ? ?Assessment and Plan:  ? ?  ICD-10-CM   ?1. COVID-19  U07.1   ?  ?2. Acute cough  R05.1 POC COVID-19  ?  POC Influenza A&B (Binax test)  ?  ?3. Fever, unspecified fever cause  R50.9 POC COVID-19  ?  POC Influenza A&B (Binax test)  ?  ? ?Confirmed COVID-19.  Within 5-day window, 5-day onset of illness in a 51 year old with multiple risk factors.  Molnupiravir based on current medications, continue with supportive care and I will also add some Tussionex for symptom management. ? ?Medication Management during today's office visit: ?Meds ordered this encounter  ?Medications  ? molnupiravir EUA (LAGEVRIO) 200 mg CAPS capsule  ?  Sig: Take 4 capsules (800 mg total) by mouth 2 (two) times daily for 5 days.  ?  Dispense:  40 capsule  ?  Refill:  0  ? chlorpheniramine-HYDROcodone (TUSSIONEX PENNKINETIC ER) 10-8 MG/5ML  ?  Sig: Take 5 mLs by mouth every 12 (twelve) hours as needed for cough.  ?  Dispense:  115 mL  ?  Refill:  0  ? ?Medications Discontinued During This Encounter  ?Medication Reason  ? Elagolix Sodium (ORILISSA) 150 MG TABS Completed Course  ? ? ?Orders placed today for conditions managed today: ?Orders Placed This Encounter  ?Procedures  ? POC  COVID-19  ? POC Influenza A&B (Binax test)  ? ? ?Follow-up if needed: No follow-ups on file. ? ?Dragon [employer] One speech-to-text software was used for transcription in this dictation.  Possible transcriptional errors can occur using Editor, commissioning.  ? ?Signed, ? ?Seanpaul Preece T. Ismeal Heider, MD ? ? ?Outpatient Encounter Medications as of 10/17/2021  ?Medication Sig  ? chlorpheniramine-HYDROcodone (TUSSIONEX PENNKINETIC ER) 10-8 MG/5ML Take 5 mLs by mouth every 12 (twelve) hours as needed for cough.  ? molnupiravir EUA (LAGEVRIO) 200 mg CAPS capsule Take 4 capsules (800 mg total) by mouth 2 (two) times daily for 5 days.  ? traMADol (ULTRAM) 50 MG tablet Take by mouth.  ? Ascorbic Acid (VITAMIN C) 1000 MG tablet Take 1,000 mg by mouth daily.  ? Calcium Citrate 200  MG TABS Take by mouth.  ? Cholecalciferol 25 MCG (1000 UT) CHEW Chew by mouth.  ? COD LIVER OIL PO Take 1 tablet by mouth daily.  ? ferrous sulfate (FER-IN-SOL) 75 (15 FE) MG/ML SOLN Take 15 mg of iron by mouth daily.  ? FLUoxetine (PROZAC) 20 MG tablet TAKE 2 TABLETS BY MOUTH EVERY DAY  ? fluticasone (FLONASE) 50 MCG/ACT nasal spray PLACE 1 SPRAY INTO BOTH NOSTRILS 2 (TWO) TIMES DAILY  ? hydroxychloroquine (PLAQUENIL) 200 MG tablet Take by mouth 2 (two) times daily.  ? MAGNESIUM PO Take 500 mg by mouth daily.  ? multivitamin-iron-minerals-folic acid (CENTRUM) chewable tablet Chew 2 tablets by mouth daily.  ? naproxen (NAPROSYN) 500 MG tablet Take 500 mg by mouth 2 (two) times daily with a meal.  ? NIFEdipine (PROCARDIA-XL/NIFEDICAL-XL) 30 MG 24 hr tablet TAKE 1 TABLET BY MOUTH EVERY DAY  ? pilocarpine (SALAGEN) 5 MG tablet Take 5 mg by mouth 3 (three) times daily.  ? traZODone (DESYREL) 50 MG tablet TAKE 0.5-1 TABLETS (25-50 MG TOTAL) BY MOUTH AT BEDTIME AS NEEDED. FOR SLEEP  ? [DISCONTINUED] Elagolix Sodium (ORILISSA) 150 MG TABS Take 150 mg by mouth daily. (Patient not taking: Reported on 02/22/2021)  ? ?No facility-administered encounter medications on file as of 10/17/2021.  ?  ?

## 2021-12-16 ENCOUNTER — Ambulatory Visit: Payer: BC Managed Care – PPO | Admitting: Family Medicine

## 2021-12-16 VITALS — BP 102/78 | HR 71 | Temp 98.6°F | Resp 16 | Ht 68.25 in | Wt 256.2 lb

## 2021-12-16 DIAGNOSIS — Z1159 Encounter for screening for other viral diseases: Secondary | ICD-10-CM | POA: Diagnosis not present

## 2021-12-16 DIAGNOSIS — Z01818 Encounter for other preprocedural examination: Secondary | ICD-10-CM | POA: Diagnosis not present

## 2021-12-16 DIAGNOSIS — E669 Obesity, unspecified: Secondary | ICD-10-CM | POA: Diagnosis not present

## 2021-12-16 DIAGNOSIS — E7800 Pure hypercholesterolemia, unspecified: Secondary | ICD-10-CM

## 2021-12-16 LAB — LIPID PANEL
Cholesterol: 219 mg/dL — ABNORMAL HIGH (ref 0–200)
HDL: 77.5 mg/dL (ref >39.00)
LDL Cholesterol: 131 mg/dL — ABNORMAL HIGH (ref 0–99)
NonHDL: 141.36
Total CHOL/HDL Ratio: 3
Triglycerides: 51 mg/dL (ref 0.0–149.0)
VLDL: 10.2 mg/dL (ref 0.0–40.0)

## 2021-12-16 LAB — CBC WITH DIFFERENTIAL/PLATELET
Basophils Absolute: 0 10*3/uL (ref 0.0–0.1)
Basophils Relative: 0.6 % (ref 0.0–3.0)
Eosinophils Absolute: 0 10*3/uL (ref 0.0–0.7)
Eosinophils Relative: 0.6 % (ref 0.0–5.0)
HCT: 40.2 % (ref 36.0–46.0)
Hemoglobin: 13.4 g/dL (ref 12.0–15.0)
Lymphocytes Relative: 30.2 % (ref 12.0–46.0)
Lymphs Abs: 1 10*3/uL (ref 0.7–4.0)
MCHC: 33.4 g/dL (ref 30.0–36.0)
MCV: 93 fl (ref 78.0–100.0)
Monocytes Absolute: 0.4 10*3/uL (ref 0.1–1.0)
Monocytes Relative: 10.9 % (ref 3.0–12.0)
Neutro Abs: 1.9 10*3/uL (ref 1.4–7.7)
Neutrophils Relative %: 57.7 % (ref 43.0–77.0)
Platelets: 234 10*3/uL (ref 150.0–400.0)
RBC: 4.32 Mil/uL (ref 3.87–5.11)
RDW: 14.3 % (ref 11.5–15.5)
WBC: 3.3 10*3/uL — ABNORMAL LOW (ref 4.0–10.5)

## 2021-12-16 LAB — COMPREHENSIVE METABOLIC PANEL
ALT: 21 U/L (ref 0–35)
AST: 21 U/L (ref 0–37)
Albumin: 4.6 g/dL (ref 3.5–5.2)
Alkaline Phosphatase: 58 U/L (ref 39–117)
BUN: 15 mg/dL (ref 6–23)
CO2: 27 mEq/L (ref 19–32)
Calcium: 9.4 mg/dL (ref 8.4–10.5)
Chloride: 103 mEq/L (ref 96–112)
Creatinine, Ser: 0.71 mg/dL (ref 0.40–1.20)
GFR: 99.68 mL/min (ref >60.00)
Glucose, Bld: 80 mg/dL (ref 70–99)
Potassium: 4 mEq/L (ref 3.5–5.1)
Sodium: 137 mEq/L (ref 135–145)
Total Bilirubin: 0.5 mg/dL (ref 0.2–1.2)
Total Protein: 8 g/dL (ref 6.0–8.3)

## 2021-12-16 LAB — HEMOGLOBIN A1C: Hgb A1c MFr Bld: 5.2 % (ref 4.6–6.5)

## 2021-12-16 NOTE — Assessment & Plan Note (Signed)
Moderate risk procedure in a healthy individual with minimal cardiovascular risk. Fitness level 4-7 mets.  EKG: normal EKG, normal sinus rhythm, there are no previous tracings available for comparison.  Will eval with labs to determine pre operative status.   Otherwise low rick for operative complications.  Encouraged incentive spirometry and  Physical activity as soon as able pre-op to avoid complications.

## 2021-12-16 NOTE — Progress Notes (Signed)
Patient ID: Arlie Solomons, female    DOB: 07-Mar-1971, 51 y.o.   MRN: [id number]  This visit was conducted in person.  BP 102/78   Pulse 71   Temp 98.6 F (37 C)   Resp 16   Ht 5' 8.25" (1.734 m)   Wt 256 lb 4 oz (116.2 kg)   LMP 11/28/2021 (Exact Date)   SpO2 96%   BMI 38.68 kg/m    CC:  Chief Complaint  Patient presents with   Pre-op Exam    Left hip    Subjective:   HPI: Syan L Morash is a 51 y.o. female presenting on 12/16/2021 for Pre-op Exam (Left hip)   She is having total left hip replacement.  She will be intubated and they will use general anesthesia.   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 No complication with anesthesia, surgery, healing, intubation etc.    NO MI or hear tissues in last 6 month.   NO CP, no SOB,  no edema, No lightheadedness.   Before her hip issues.Marland Kitchen she was able to walk 3 miles (took 1 hour) a day 4 days a week. Met 4-7 level.   NO swallowing issues, no sleep apnea.   Relevant past medical, surgical, family and social history reviewed and updated as indicated. Interim medical history since our last visit reviewed. Allergies and medications reviewed and updated. Outpatient Medications Prior to Visit  Medication Sig Dispense Refill   Ascorbic Acid (VITAMIN C) 1000 MG tablet Take 1,000 mg by mouth daily.     Calcium Citrate 200 MG TABS Take by mouth.     chlorpheniramine-HYDROcodone (TUSSIONEX PENNKINETIC ER) 10-8 MG/5ML Take 5 mLs by mouth every 12 (twelve) hours as needed for cough. 115 mL 0   Cholecalciferol 25 MCG (1000 UT) CHEW Chew by mouth.     COD LIVER OIL PO Take 1 tablet by mouth daily.     ferrous sulfate (FER-IN-SOL) 75 (15 FE) MG/ML SOLN Take 15 mg of iron by mouth daily.     FLUoxetine (PROZAC) 20 MG tablet TAKE 2 TABLETS BY MOUTH EVERY DAY 180 tablet 1   fluticasone (FLONASE) 50 MCG/ACT nasal spray PLACE 1 SPRAY INTO BOTH NOSTRILS 2 (TWO) TIMES DAILY 48 mL 0   hydroxychloroquine (PLAQUENIL) 200 MG tablet  Take by mouth 2 (two) times daily.     MAGNESIUM PO Take 500 mg by mouth daily.     multivitamin-iron-minerals-folic acid (CENTRUM) chewable tablet Chew 2 tablets by mouth daily.     naproxen (NAPROSYN) 500 MG tablet Take 500 mg by mouth 2 (two) times daily with a meal.     NIFEdipine (PROCARDIA-XL/NIFEDICAL-XL) 30 MG 24 hr tablet TAKE 1 TABLET BY MOUTH EVERY DAY 90 tablet 1   pilocarpine (SALAGEN) 5 MG tablet Take 5 mg by mouth 3 (three) times daily.     traMADol (ULTRAM) 50 MG tablet Take by mouth.     traZODone (DESYREL) 50 MG tablet TAKE 0.5-1 TABLETS (25-50 MG TOTAL) BY MOUTH AT BEDTIME AS NEEDED. FOR SLEEP 90 tablet 1   No facility-administered medications prior to visit.     Per HPI unless specifically indicated in ROS section below Review of Systems  Constitutional:  Negative for fatigue and fever.  HENT:  Negative for congestion.   Eyes:  Negative for pain.  Respiratory:  Negative for cough and shortness of breath.   Cardiovascular:  Negative for chest pain, palpitations and leg swelling.  Gastrointestinal:  Negative for abdominal pain.  Genitourinary:  Negative for dysuria and vaginal bleeding.  Musculoskeletal:  Negative for back pain.  Neurological:  Negative for syncope, light-headedness and headaches.  Psychiatric/Behavioral:  Negative for dysphoric mood.    Objective:  BP 102/78   Pulse 71   Temp 98.6 F (37 C)   Resp 16   Ht 5' 8.25" (1.734 m)   Wt 256 lb 4 oz (116.2 kg)   LMP 11/28/2021 (Exact Date)   SpO2 96%   BMI 38.68 kg/m   Wt Readings from Last 3 Encounters:  12/16/21 256 lb 4 oz (116.2 kg)  10/17/21 271 lb 6 oz (123.1 kg)  02/22/21 281 lb (127.5 kg)      Physical Exam Vitals and nursing note reviewed.  Constitutional:      General: She is not in acute distress.    Appearance: Normal appearance. She is well-developed. She is obese. She is not ill-appearing or toxic-appearing.  HENT:     Head: Normocephalic.     Right Ear: Hearing, tympanic  membrane, ear canal and external ear normal.     Left Ear: Hearing, tympanic membrane, ear canal and external ear normal.     Nose: Nose normal.  Eyes:     General: Lids are normal. Lids are everted, no foreign bodies appreciated.     Conjunctiva/sclera: Conjunctivae normal.     Pupils: Pupils are equal, round, and reactive to light.  Neck:     Thyroid: No thyroid mass or thyromegaly.     Vascular: No carotid bruit.     Trachea: Trachea normal.  Cardiovascular:     Rate and Rhythm: Normal rate and regular rhythm.     Heart sounds: Normal heart sounds, S1 normal and S2 normal. No murmur heard.    No gallop.  Pulmonary:     Effort: Pulmonary effort is normal. No respiratory distress.     Breath sounds: Normal breath sounds. No wheezing, rhonchi or rales.  Abdominal:     General: Bowel sounds are normal. There is no distension or abdominal bruit.     Palpations: Abdomen is soft. There is no fluid wave or mass.     Tenderness: There is no abdominal tenderness. There is no guarding or rebound.     Hernia: No hernia is present.  Musculoskeletal:     Cervical back: Normal range of motion and neck supple.  Lymphadenopathy:     Cervical: No cervical adenopathy.  Skin:    General: Skin is warm and dry.     Findings: No rash.  Neurological:     Mental Status: She is alert.     Cranial Nerves: No cranial nerve deficit.     Sensory: No sensory deficit.  Psychiatric:        Mood and Affect: Mood is not anxious or depressed.        Speech: Speech normal.        Behavior: Behavior normal. Behavior is cooperative.        Judgment: Judgment normal.       Results for orders placed or performed in visit on 10/17/21  POC COVID-19  Result Value Ref Range   SARS Coronavirus 2 Ag Positive (A) Negative  POC Influenza A&B (Binax test)  Result Value Ref Range   Influenza A, POC Negative Negative   Influenza B, POC Negative Negative     COVID 19 screen:  No recent travel or known exposure to  COVID19 The patient denies respiratory symptoms of COVID 19 at this time. The  importance of social distancing was discussed today.   Assessment and Plan    Problem List Items Addressed This Visit     High cholesterol   Relevant Orders   Lipid panel   Comprehensive metabolic panel   Obesity (BMI 35.0-39.9 without comorbidity)   Relevant Orders   Hemoglobin A1c   Preoperative clearance - Primary    Moderate risk procedure in a healthy individual with minimal cardiovascular risk. Fitness level 4-7 mets.  EKG: normal EKG, normal sinus rhythm, there are no previous tracings available for comparison.  Will eval with labs to determine pre operative status.   Otherwise low rick for operative complications.  Encouraged incentive spirometry and  Physical activity as soon as able pre-op to avoid complications.       Relevant Orders   CBC with Differential/Platelet   Other Visit Diagnoses     Need for hepatitis C screening test       Relevant Orders   Hepatitis C antibody        Eliezer Lofts, MD

## 2021-12-16 NOTE — Patient Instructions (Signed)
Please stop at the lab to have labs drawn.  

## 2021-12-16 NOTE — Addendum Note (Signed)
Addended by: Emelia Salisbury C on: 12/16/2021 09:00 AM   Modules accepted: Orders

## 2021-12-19 LAB — HEPATITIS C ANTIBODY: Hepatitis C Ab: NONREACTIVE

## 2022-01-01 ENCOUNTER — Other Ambulatory Visit: Payer: Self-pay | Admitting: Family Medicine

## 2022-01-10 ENCOUNTER — Other Ambulatory Visit: Payer: Self-pay | Admitting: Family Medicine

## 2022-01-10 DIAGNOSIS — B9789 Other viral agents as the cause of diseases classified elsewhere: Secondary | ICD-10-CM

## 2022-01-12 ENCOUNTER — Other Ambulatory Visit: Payer: BC Managed Care – PPO

## 2022-01-19 ENCOUNTER — Ambulatory Visit (INDEPENDENT_AMBULATORY_CARE_PROVIDER_SITE_OTHER): Payer: BC Managed Care – PPO | Admitting: Family Medicine

## 2022-01-19 ENCOUNTER — Encounter: Payer: Self-pay | Admitting: Family Medicine

## 2022-01-19 VITALS — BP 114/80 | HR 74 | Ht 69.0 in | Wt 258.4 lb

## 2022-01-19 DIAGNOSIS — Z0000 Encounter for general adult medical examination without abnormal findings: Secondary | ICD-10-CM

## 2022-01-19 NOTE — Patient Instructions (Addendum)
After surgery get back on track as able with regular exercise. Keep working on [employer] and weight loss.

## 2022-01-19 NOTE — Progress Notes (Signed)
Patient ID: Arlie Solomons, female    DOB: [birth year], 51 y.o.   MRN: [id number]  This visit was conducted in person.  BP 114/80   Pulse 74   Ht '5\' 9"'$  (1.753 m)   Wt 258 lb 6.4 oz (117.2 kg)   SpO2 94%   BMI 38.16 kg/m    CC:  Chief Complaint  Patient presents with   Annual Exam    Physical and discuss lab results     Subjective:   HPI: Viktorya EARLY ORD is a 51 y.o. female presenting on 01/19/2022 for Annual Exam (Physical and discuss lab results )   Reviewed labs in detail  Elevated Cholesterol:  Increase red rice   to 2 capsules daily ( 1200 mg daily) Lab Results  Component Value Date   CHOL 219 (H) 12/16/2021   HDL 77.50 12/16/2021   LDLCALC 131 (H) 12/16/2021   LDLDIRECT 142.3 01/09/2013   TRIG 51.0 12/16/2021   CHOLHDL 3 12/16/2021   The 10-year ASCVD risk score (Arnett DK, et al., 2019) is: 0.7%   Values used to calculate the score:     Age: 51 years     Sex: Female     Is Non-Hispanic African American: No     Diabetic: No     Tobacco smoker: No     Systolic Blood Pressure: 749 mmHg     Is BP treated: No     HDL Cholesterol: 77.5 mg/dL     Total Cholesterol: 219 mg/dL Using medications without problems:none Muscle aches: none Diet compliance: Exercise: limited die to  Other complaints:  MDD: Improved overall on trazodone and fluoxetine Flowsheet Row Office Visit from 01/19/2022 in [place] at [facility] Total Score 0          Followed by rheumatology for Sjogren's and Raynaud's       Relevant past medical, surgical, family and social history reviewed and updated as indicated. Interim medical history since our last visit reviewed. Allergies and medications reviewed and updated. Outpatient Medications Prior to Visit  Medication Sig Dispense Refill   Ascorbic Acid (VITAMIN C) 1000 MG tablet Take 1,000 mg by mouth daily.     Calcium Citrate 200 MG TABS Take by mouth.     Cholecalciferol 25 MCG (1000 UT) CHEW Chew by  mouth.     COD LIVER OIL PO Take 1 tablet by mouth daily.     ferrous sulfate (FER-IN-SOL) 75 (15 FE) MG/ML SOLN Take 15 mg of iron by mouth daily.     FLUoxetine (PROZAC) 20 MG tablet TAKE 2 TABLETS BY MOUTH EVERY DAY 180 tablet 0   fluticasone (FLONASE) 50 MCG/ACT nasal spray PLACE 1 SPRAY INTO BOTH NOSTRILS 2 (TWO) TIMES DAILY 48 mL 0   hydroxychloroquine (PLAQUENIL) 200 MG tablet Take by mouth 2 (two) times daily.     MAGNESIUM PO Take 500 mg by mouth daily.     multivitamin-iron-minerals-folic acid (CENTRUM) chewable tablet Chew 2 tablets by mouth daily.     naproxen (NAPROSYN) 500 MG tablet Take 500 mg by mouth 2 (two) times daily with a meal.     NIFEdipine (PROCARDIA-XL/NIFEDICAL-XL) 30 MG 24 hr tablet TAKE 1 TABLET BY MOUTH EVERY DAY 90 tablet 1   pilocarpine (SALAGEN) 5 MG tablet Take 5 mg by mouth 3 (three) times daily.     traMADol (ULTRAM) 50 MG tablet Take by mouth.     traZODone (DESYREL) 50 MG tablet TAKE 1/2 TO 1 TABLET  BY MOUTH AT BEDTIME AS NEEDED FOR SLEEP 90 tablet 0   chlorpheniramine-HYDROcodone (TUSSIONEX PENNKINETIC ER) 10-8 MG/5ML Take 5 mLs by mouth every 12 (twelve) hours as needed for cough. 115 mL 0   No facility-administered medications prior to visit.     Per HPI unless specifically indicated in ROS section below Review of Systems  Constitutional:  Negative for fatigue and fever.  HENT:  Negative for congestion.   Eyes:  Negative for pain.  Respiratory:  Negative for cough and shortness of breath.   Cardiovascular:  Negative for chest pain, palpitations and leg swelling.  Gastrointestinal:  Negative for abdominal pain.  Genitourinary:  Negative for dysuria and vaginal bleeding.  Musculoskeletal:  Negative for back pain.  Neurological:  Negative for syncope, light-headedness and headaches.  Psychiatric/Behavioral:  Negative for dysphoric mood.    Objective:  BP 114/80   Pulse 74   Ht '5\' 9"'$  (1.753 m)   Wt 258 lb 6.4 oz (117.2 kg)   SpO2 94%   BMI  38.16 kg/m   Wt Readings from Last 3 Encounters:  01/19/22 258 lb 6.4 oz (117.2 kg)  12/16/21 256 lb 4 oz (116.2 kg)  10/17/21 271 lb 6 oz (123.1 kg)      Physical Exam Vitals and nursing note reviewed.  Constitutional:      General: She is not in acute distress.    Appearance: Normal appearance. She is well-developed. She is not ill-appearing or toxic-appearing.  HENT:     Head: Normocephalic.     Right Ear: Hearing, tympanic membrane, ear canal and external ear normal.     Left Ear: Hearing, tympanic membrane, ear canal and external ear normal.     Nose: Nose normal.  Eyes:     General: Lids are normal. Lids are everted, no foreign bodies appreciated.     Conjunctiva/sclera: Conjunctivae normal.     Pupils: Pupils are equal, round, and reactive to light.  Neck:     Thyroid: No thyroid mass or thyromegaly.     Vascular: No carotid bruit.     Trachea: Trachea normal.  Cardiovascular:     Rate and Rhythm: Normal rate and regular rhythm.     Heart sounds: Normal heart sounds, S1 normal and S2 normal. No murmur heard.    No gallop.  Pulmonary:     Effort: Pulmonary effort is normal. No respiratory distress.     Breath sounds: Normal breath sounds. No wheezing, rhonchi or rales.  Abdominal:     General: Bowel sounds are normal. There is no distension or abdominal bruit.     Palpations: Abdomen is soft. There is no fluid wave or mass.     Tenderness: There is no abdominal tenderness. There is no guarding or rebound.     Hernia: No hernia is present.  Musculoskeletal:     Cervical back: Normal range of motion and neck supple.  Lymphadenopathy:     Cervical: No cervical adenopathy.  Skin:    General: Skin is warm and dry.     Findings: No rash.  Neurological:     Mental Status: She is alert.     Cranial Nerves: No cranial nerve deficit.     Sensory: No sensory deficit.  Psychiatric:        Mood and Affect: Mood is not anxious or depressed.        Speech: Speech normal.         Behavior: Behavior normal. Behavior is cooperative.  Judgment: Judgment normal.       Results for orders placed or performed in visit on 12/16/21  Hemoglobin A1c  Result Value Ref Range   Hgb A1c MFr Bld 5.2 4.6 - 6.5 %  Lipid panel  Result Value Ref Range   Cholesterol 219 (H) 0 - 200 mg/dL   Triglycerides 51.0 0.0 - 149.0 mg/dL   HDL 77.50 >39.00 mg/dL   VLDL 10.2 0.0 - 40.0 mg/dL   LDL Cholesterol 131 (H) 0 - 99 mg/dL   Total CHOL/HDL Ratio 3    NonHDL 141.36   Comprehensive metabolic panel  Result Value Ref Range   Sodium 137 135 - 145 mEq/L   Potassium 4.0 3.5 - 5.1 mEq/L   Chloride 103 96 - 112 mEq/L   CO2 27 19 - 32 mEq/L   Glucose, Bld 80 70 - 99 mg/dL   BUN 15 6 - 23 mg/dL   Creatinine, Ser 0.71 0.40 - 1.20 mg/dL   Total Bilirubin 0.5 0.2 - 1.2 mg/dL   Alkaline Phosphatase 58 39 - 117 U/L   AST 21 0 - 37 U/L   ALT 21 0 - 35 U/L   Total Protein 8.0 6.0 - 8.3 g/dL   Albumin 4.6 3.5 - 5.2 g/dL   GFR 99.68 >60.00 mL/min   Calcium 9.4 8.4 - 10.5 mg/dL  Hepatitis C antibody  Result Value Ref Range   Hepatitis C Ab NON-REACTIVE NON-REACTIVE  CBC with Differential/Platelet  Result Value Ref Range   WBC 3.3 (L) 4.0 - 10.5 K/uL   RBC 4.32 3.87 - 5.11 Mil/uL   Hemoglobin 13.4 12.0 - 15.0 g/dL   HCT 40.2 36.0 - 46.0 %   MCV 93.0 78.0 - 100.0 fl   MCHC 33.4 30.0 - 36.0 g/dL   RDW 14.3 11.5 - 15.5 %   Platelets 234.0 150.0 - 400.0 K/uL   Neutrophils Relative % 57.7 43.0 - 77.0 %   Lymphocytes Relative 30.2 12.0 - 46.0 %   Monocytes Relative 10.9 3.0 - 12.0 %   Eosinophils Relative 0.6 0.0 - 5.0 %   Basophils Relative 0.6 0.0 - 3.0 %   Neutro Abs 1.9 1.4 - 7.7 K/uL   Lymphs Abs 1.0 0.7 - 4.0 K/uL   Monocytes Absolute 0.4 0.1 - 1.0 K/uL   Eosinophils Absolute 0.0 0.0 - 0.7 K/uL   Basophils Absolute 0.0 0.0 - 0.1 K/uL     COVID 19 screen:  No recent travel or known exposure to COVID19 The patient denies respiratory symptoms of COVID 19 at this  time. The importance of social distancing was discussed today.   Assessment and Plan   The patient's preventative maintenance and recommended screening tests for an annual wellness exam were reviewed in full today. Brought up to date unless services declined.  Counselled on the importance of diet, exercise, and its role in overall health and mortality. The patient's FH and SH was reviewed, including their home life, tobacco status, and drug and alcohol status.   pap/DVE: per GYN, nml pap 2019, plane repeat 5 years.  Mammo:  01/2021 nml.. will set up after hip surgery.  Vaccines: Uptodate with pneumonia vaccine given immunocomp, TDAP, flu uptodate Nonsmoker Colon: no early  family colon cancer... 02/2021  No desire for STD testing.  Wine glass of wine a week  Hep c : due  Wt Readings from Last 3 Encounters:  01/19/22 258 lb 6.4 oz (117.2 kg)  12/16/21 256 lb 4 oz (116.2 kg)  10/17/21  271 lb 6 oz (123.1 kg)      Eliezer Lofts, MD

## 2022-01-20 ENCOUNTER — Other Ambulatory Visit: Payer: Self-pay | Admitting: Family Medicine

## 2022-01-20 DIAGNOSIS — Z1231 Encounter for screening mammogram for malignant neoplasm of breast: Secondary | ICD-10-CM

## 2022-02-08 ENCOUNTER — Ambulatory Visit
Admission: RE | Admit: 2022-02-08 | Discharge: 2022-02-08 | Disposition: A | Discharge status: Home / Self Care | Payer: BC Managed Care – PPO | Source: Ambulatory Visit

## 2022-02-08 DIAGNOSIS — Z1231 Encounter for screening mammogram for malignant neoplasm of breast: Secondary | ICD-10-CM

## 2022-02-24 ENCOUNTER — Telehealth: Payer: Self-pay | Admitting: Family Medicine

## 2022-02-24 NOTE — Telephone Encounter (Signed)
Verbal orders given to [facility] via telephone for PT 1 x for 1 week, 2 x for 3 weeks, and 1 x for 4 week

## 2022-02-24 NOTE — Telephone Encounter (Signed)
Home Health verbal orders Caller Name: Olevia Bowens Agency Name: [facility] number: [phone]  Requesting PT  Frequency: 1x for 1 week, 2x for 3 weeks, and 1x for 4 week  Please forward to [facility] pool or providers CMA

## 2022-02-24 NOTE — Telephone Encounter (Signed)
Noted  

## 2022-02-27 ENCOUNTER — Telehealth: Payer: Self-pay | Admitting: Family Medicine

## 2022-02-27 NOTE — Telephone Encounter (Signed)
Home Health verbal orders Caller Name: [place] Name: [facility] number: [phone] ([phone]), no [phone]  Requesting OT/PT/Skilled nursing/Social Work/Speech: OT   Reason:  Frequency: once a week for 6 weeks   Please forward to [facility] pool or providers CMA

## 2022-02-27 NOTE — Telephone Encounter (Signed)
Verbal orders given to [facility] via telephone for OT once a week for 6 weeks.

## 2022-02-28 ENCOUNTER — Other Ambulatory Visit: Payer: Self-pay | Admitting: Family Medicine

## 2022-02-28 DIAGNOSIS — B9789 Other viral agents as the cause of diseases classified elsewhere: Secondary | ICD-10-CM

## 2022-04-08 ENCOUNTER — Other Ambulatory Visit: Payer: Self-pay | Admitting: Family Medicine

## 2022-04-12 ENCOUNTER — Other Ambulatory Visit: Payer: Self-pay | Admitting: Family Medicine

## 2022-05-09 ENCOUNTER — Telehealth: Payer: Self-pay | Admitting: Family Medicine

## 2022-05-09 NOTE — Telephone Encounter (Signed)
Please call patient to clarify.  She has already had her physical for this year and she is not due for 6 month labs. Her appointment is for follow-up.  What is she wanting to have [place] check?

## 2022-05-09 NOTE — Telephone Encounter (Signed)
Patient is scheduled to come in to be seen on 12/7,she would like to know if orders can be placed for her to come in tomorrow 12/6 for her lab work?

## 2022-05-10 NOTE — Telephone Encounter (Signed)
Spoke with Jessica Aguirre.  She states she was wanting any labs that could be drawn for menopause.  I advised her that Dr. Diona Browner would prefer to  draw labs the same day once she has been seen and her symptoms have been discussed.   Patient states understanding.

## 2022-05-11 ENCOUNTER — Ambulatory Visit (INDEPENDENT_AMBULATORY_CARE_PROVIDER_SITE_OTHER): Payer: BC Managed Care – PPO | Admitting: Family Medicine

## 2022-05-11 ENCOUNTER — Encounter: Payer: Self-pay | Admitting: Family Medicine

## 2022-05-11 VITALS — BP 100/62 | HR 71 | Temp 98.2°F | Ht 69.0 in | Wt 233.4 lb

## 2022-05-11 DIAGNOSIS — R61 Generalized hyperhidrosis: Secondary | ICD-10-CM | POA: Diagnosis not present

## 2022-05-11 DIAGNOSIS — N920 Excessive and frequent menstruation with regular cycle: Secondary | ICD-10-CM

## 2022-05-11 NOTE — Patient Instructions (Addendum)
Please stop at the lab to have labs drawn. Increase trazodone to 100 mg at bedtime. After labs we can consider  increasing prozac to 3 tablets daily ( 60 mg daily)... or changing to celexa if not helping in 4 weeks.

## 2022-05-11 NOTE — Progress Notes (Signed)
Patient ID: Jessica Aguirre, female    DOB: [birth year], 51 y.o.   MRN: [id number]  This visit was conducted in person.  BP 100/62   Pulse 71   Temp 98.2 F (36.8 C) (Oral)   Ht '5\' 9"'$  (1.753 m)   Wt 233 lb 6 oz (105.9 kg)   LMP 04/21/2022   SpO2 100%   BMI 34.46 kg/m    CC:  Chief Complaint  Patient presents with   Menopause    Subjective:   HPI: Jessica Aguirre is a 51 y.o. female presenting on 05/11/2022 for Menopause  She reports she wakes up at night drenched in sweat. Foggy brain feeling. Started since 02/2022 hip replacement. No specific hot flashes.  Some moodiness.  Menses is regular but very heavy.  Constipation prior to menses then resulting in  diarrhea during menses.Marland Kitchen ongoing x 6-7 months  Using 8-10 pads a day. Soaking them     Trouble sleeping at night.. using trazodone  50 mg for sleep not terribly effective. No SE.  Relevant past medical, surgical, family and social history reviewed and updated as indicated. Interim medical history since our last visit reviewed. Allergies and medications reviewed and updated. Outpatient Medications Prior to Visit  Medication Sig Dispense Refill   Ascorbic Acid (VITAMIN C) 1000 MG tablet Take 1,000 mg by mouth daily.     Calcium Citrate 200 MG TABS Take by mouth.     Cholecalciferol 25 MCG (1000 UT) CHEW Chew by mouth.     COD LIVER OIL PO Take 1 tablet by mouth daily.     ferrous sulfate (FER-IN-SOL) 75 (15 FE) MG/ML SOLN Take 15 mg of iron by mouth daily.     FLUoxetine (PROZAC) 20 MG tablet TAKE 2 TABLETS BY MOUTH EVERY DAY 180 tablet 1   fluticasone (FLONASE) 50 MCG/ACT nasal spray PLACE 1 SPRAY INTO BOTH NOSTRILS 2 (TWO) TIMES DAILY 48 mL 0   hydroxychloroquine (PLAQUENIL) 200 MG tablet Take by mouth 2 (two) times daily.     MAGNESIUM PO Take 500 mg by mouth daily.     multivitamin-iron-minerals-folic acid (CENTRUM) chewable tablet Chew 2 tablets by mouth daily.     NIFEdipine (PROCARDIA-XL/NIFEDICAL-XL) 30  MG 24 hr tablet TAKE 1 TABLET BY MOUTH EVERY DAY 90 tablet 1   pilocarpine (SALAGEN) 5 MG tablet Take 5 mg by mouth 3 (three) times daily.     traZODone (DESYREL) 50 MG tablet TAKE 1/2 TO 1 TABLET BY MOUTH AT BEDTIME AS NEEDED FOR SLEEP 90 tablet 1   naproxen (NAPROSYN) 500 MG tablet Take 500 mg by mouth 2 (two) times daily with a meal.     traMADol (ULTRAM) 50 MG tablet Take by mouth.     No facility-administered medications prior to visit.     Per HPI unless specifically indicated in ROS section below Review of Systems  Constitutional:  Negative for fatigue and fever.  HENT:  Negative for congestion.   Eyes:  Negative for pain.  Respiratory:  Negative for cough and shortness of breath.   Cardiovascular:  Negative for chest pain, palpitations and leg swelling.  Gastrointestinal:  Negative for abdominal pain.  Genitourinary:  Positive for menstrual problem. Negative for dysuria and vaginal bleeding.  Musculoskeletal:  Negative for back pain.  Neurological:  Negative for syncope, light-headedness and headaches.  Psychiatric/Behavioral:  Negative for dysphoric mood.    Objective:  BP 100/62   Pulse 71   Temp 98.2 F (36.8 C) (Oral)  Ht '5\' 9"'$  (1.753 m)   Wt 233 lb 6 oz (105.9 kg)   LMP 04/21/2022   SpO2 100%   BMI 34.46 kg/m   Wt Readings from Last 3 Encounters:  05/11/22 233 lb 6 oz (105.9 kg)  01/19/22 258 lb 6.4 oz (117.2 kg)  12/16/21 256 lb 4 oz (116.2 kg)      Physical Exam Constitutional:      General: She is not in acute distress.    Appearance: Normal appearance. She is well-developed. She is obese. She is not ill-appearing or toxic-appearing.  HENT:     Head: Normocephalic.     Right Ear: Hearing, tympanic membrane, ear canal and external ear normal. Tympanic membrane is not erythematous, retracted or bulging.     Left Ear: Hearing, tympanic membrane, ear canal and external ear normal. Tympanic membrane is not erythematous, retracted or bulging.     Nose: No  mucosal edema or rhinorrhea.     Right Sinus: No maxillary sinus tenderness or frontal sinus tenderness.     Left Sinus: No maxillary sinus tenderness or frontal sinus tenderness.     Mouth/Throat:     Pharynx: Uvula midline.  Eyes:     General: Lids are normal. Lids are everted, no foreign bodies appreciated.     Conjunctiva/sclera: Conjunctivae normal.     Pupils: Pupils are equal, round, and reactive to light.  Neck:     Thyroid: No thyroid mass or thyromegaly.     Vascular: No carotid bruit.     Trachea: Trachea normal.  Cardiovascular:     Rate and Rhythm: Normal rate and regular rhythm.     Pulses: Normal pulses.     Heart sounds: Normal heart sounds, S1 normal and S2 normal. No murmur heard.    No friction rub. No gallop.  Pulmonary:     Effort: Pulmonary effort is normal. No tachypnea or respiratory distress.     Breath sounds: Normal breath sounds. No decreased breath sounds, wheezing, rhonchi or rales.  Abdominal:     General: Bowel sounds are normal.     Palpations: Abdomen is soft.     Tenderness: There is no abdominal tenderness.  Musculoskeletal:     Cervical back: Normal range of motion and neck supple.  Skin:    General: Skin is warm and dry.     Findings: No rash.  Neurological:     Mental Status: She is alert.  Psychiatric:        Mood and Affect: Mood is not anxious or depressed.        Speech: Speech normal.        Behavior: Behavior normal. Behavior is cooperative.        Thought Content: Thought content normal.        Judgment: Judgment normal.       Results for orders placed or performed in visit on 12/16/21  Hemoglobin A1c  Result Value Ref Range   Hgb A1c MFr Bld 5.2 4.6 - 6.5 %  Lipid panel  Result Value Ref Range   Cholesterol 219 (H) 0 - 200 mg/dL   Triglycerides 51.0 0.0 - 149.0 mg/dL   HDL 77.50 >39.00 mg/dL   VLDL 10.2 0.0 - 40.0 mg/dL   LDL Cholesterol 131 (H) 0 - 99 mg/dL   Total CHOL/HDL Ratio 3    NonHDL 141.36   Comprehensive  metabolic panel  Result Value Ref Range   Sodium 137 135 - 145 mEq/L   Potassium 4.0 3.5 -  5.1 mEq/L   Chloride 103 96 - 112 mEq/L   CO2 27 19 - 32 mEq/L   Glucose, Bld 80 70 - 99 mg/dL   BUN 15 6 - 23 mg/dL   Creatinine, Ser 0.71 0.40 - 1.20 mg/dL   Total Bilirubin 0.5 0.2 - 1.2 mg/dL   Alkaline Phosphatase 58 39 - 117 U/L   AST 21 0 - 37 U/L   ALT 21 0 - 35 U/L   Total Protein 8.0 6.0 - 8.3 g/dL   Albumin 4.6 3.5 - 5.2 g/dL   GFR 99.68 >60.00 mL/min   Calcium 9.4 8.4 - 10.5 mg/dL  Hepatitis C antibody  Result Value Ref Range   Hepatitis C Ab NON-REACTIVE NON-REACTIVE  CBC with Differential/Platelet  Result Value Ref Range   WBC 3.3 (L) 4.0 - 10.5 K/uL   RBC 4.32 3.87 - 5.11 Mil/uL   Hemoglobin 13.4 12.0 - 15.0 g/dL   HCT 40.2 36.0 - 46.0 %   MCV 93.0 78.0 - 100.0 fl   MCHC 33.4 30.0 - 36.0 g/dL   RDW 14.3 11.5 - 15.5 %   Platelets 234.0 150.0 - 400.0 K/uL   Neutrophils Relative % 57.7 43.0 - 77.0 %   Lymphocytes Relative 30.2 12.0 - 46.0 %   Monocytes Relative 10.9 3.0 - 12.0 %   Eosinophils Relative 0.6 0.0 - 5.0 %   Basophils Relative 0.6 0.0 - 3.0 %   Neutro Abs 1.9 1.4 - 7.7 K/uL   Lymphs Abs 1.0 0.7 - 4.0 K/uL   Monocytes Absolute 0.4 0.1 - 1.0 K/uL   Eosinophils Absolute 0.0 0.0 - 0.7 K/uL   Basophils Absolute 0.0 0.0 - 0.1 K/uL     COVID 19 screen:  No recent travel or known exposure to COVID19 The patient denies respiratory symptoms of COVID 19 at this time. The importance of social distancing was discussed today.   Assessment and Plan    Problem List Items Addressed This Visit     Night sweats - Primary   Relevant Orders   CBC with Differential/Platelet (Completed)   TSH (Completed)   IBC + Ferritin (Completed)   Follicle stimulating hormone (Completed)   Estradiol (Completed)   Luteinizing hormone (Completed)   Other Visit Diagnoses     Menorrhagia with regular cycle          Will evaluate  with lab testing for possible secondary causes  of night sweats, mood changes, alternating bowel  habits and sleep disruption.  Increase trazodone to 100 mg at bedtime. After labs we can consider  increasing prozac to 3 tablets daily ( 60 mg daily)... or changing to celexa if not helping in 4 weeks.  Eliezer Lofts, MD

## 2022-05-12 ENCOUNTER — Other Ambulatory Visit: Payer: Self-pay | Admitting: Family Medicine

## 2022-05-12 LAB — CBC WITH DIFFERENTIAL/PLATELET
Basophils Absolute: 0 10*3/uL (ref 0.0–0.1)
Basophils Relative: 0.2 % (ref 0.0–3.0)
Eosinophils Absolute: 0 10*3/uL (ref 0.0–0.7)
Eosinophils Relative: 0.3 % (ref 0.0–5.0)
HCT: 29.3 % — ABNORMAL LOW (ref 36.0–46.0)
Hemoglobin: 9.4 g/dL — ABNORMAL LOW (ref 12.0–15.0)
Lymphocytes Relative: 26.3 % (ref 12.0–46.0)
Lymphs Abs: 1.3 10*3/uL (ref 0.7–4.0)
MCHC: 32 g/dL (ref 30.0–36.0)
MCV: 75.9 fl — ABNORMAL LOW (ref 78.0–100.0)
Monocytes Absolute: 0.3 10*3/uL (ref 0.1–1.0)
Monocytes Relative: 6.8 % (ref 3.0–12.0)
Neutro Abs: 3.2 10*3/uL (ref 1.4–7.7)
Neutrophils Relative %: 66.4 % (ref 43.0–77.0)
Platelets: 334 10*3/uL (ref 150.0–400.0)
RBC: 3.86 Mil/uL — ABNORMAL LOW (ref 3.87–5.11)
RDW: 18.1 % — ABNORMAL HIGH (ref 11.5–15.5)
WBC: 4.8 10*3/uL (ref 4.0–10.5)

## 2022-05-12 LAB — IBC + FERRITIN
Ferritin: 7.9 ng/mL — ABNORMAL LOW (ref 10.0–291.0)
Iron: 15 ug/dL — ABNORMAL LOW (ref 42–145)
Saturation Ratios: 3.3 % — ABNORMAL LOW (ref 20.0–50.0)
TIBC: 448 ug/dL (ref 250.0–450.0)
Transferrin: 320 mg/dL (ref 212.0–360.0)

## 2022-05-12 LAB — ESTRADIOL: Estradiol: 199 pg/mL

## 2022-05-12 LAB — TSH: TSH: 0.98 u[IU]/mL (ref 0.35–5.50)

## 2022-05-12 LAB — FOLLICLE STIMULATING HORMONE: FSH: 3.4 m[IU]/mL

## 2022-05-12 LAB — LUTEINIZING HORMONE: LH: 12.4 m[IU]/mL

## 2022-05-12 MED ORDER — IRON (FERROUS SULFATE) 325 (65 FE) MG PO TABS: 325.0000 mg | ORAL_TABLET | Freq: Two times a day (BID) | ORAL | 2 refills | Status: DC

## 2022-06-12 DIAGNOSIS — R61 Generalized hyperhidrosis: Secondary | ICD-10-CM | POA: Insufficient documentation

## 2022-06-13 ENCOUNTER — Other Ambulatory Visit: Payer: Self-pay | Admitting: Family Medicine

## 2022-07-07 ENCOUNTER — Other Ambulatory Visit: Payer: Self-pay | Admitting: Family Medicine

## 2022-07-07 DIAGNOSIS — B9789 Other viral agents as the cause of diseases classified elsewhere: Secondary | ICD-10-CM

## 2022-08-07 ENCOUNTER — Other Ambulatory Visit: Payer: Self-pay | Admitting: Family Medicine

## 2022-08-18 ENCOUNTER — Encounter: Payer: Self-pay | Admitting: Family Medicine

## 2022-08-19 MED ORDER — TRAZODONE HCL 100 MG PO TABS: 100.0000 mg | ORAL_TABLET | Freq: Every day | ORAL | 1 refills | Status: DC

## 2022-10-10 ENCOUNTER — Other Ambulatory Visit: Payer: Self-pay | Admitting: Family Medicine

## 2023-01-08 ENCOUNTER — Other Ambulatory Visit: Payer: Self-pay | Admitting: Family Medicine

## 2023-01-08 DIAGNOSIS — B9789 Other viral agents as the cause of diseases classified elsewhere: Secondary | ICD-10-CM

## 2023-01-08 NOTE — Telephone Encounter (Signed)
Please schedule CPE with fasting labs prior with Dr. Ermalene Searing after 01/20/2023.

## 2023-01-08 NOTE — Telephone Encounter (Signed)
Lvmtcb, sent mychart message  

## 2023-01-18 ENCOUNTER — Telehealth: Payer: Self-pay | Admitting: *Deleted

## 2023-01-18 ENCOUNTER — Encounter (INDEPENDENT_AMBULATORY_CARE_PROVIDER_SITE_OTHER): Payer: Self-pay

## 2023-01-18 DIAGNOSIS — E7800 Pure hypercholesterolemia, unspecified: Secondary | ICD-10-CM

## 2023-01-18 NOTE — Telephone Encounter (Signed)
-----   Message from Alvina Chou sent at 01/18/2023 12:11 PM EDT ----- Regarding: Lab orders for Thursday, 8.29.24 Patient is scheduled for CPX labs, please order future labs, Thanks , Camelia Eng

## 2023-02-01 ENCOUNTER — Other Ambulatory Visit (INDEPENDENT_AMBULATORY_CARE_PROVIDER_SITE_OTHER): Payer: BC Managed Care – PPO

## 2023-02-01 DIAGNOSIS — E7800 Pure hypercholesterolemia, unspecified: Secondary | ICD-10-CM

## 2023-02-01 LAB — COMPREHENSIVE METABOLIC PANEL
ALT: 22 U/L (ref 0–35)
AST: 23 U/L (ref 0–37)
Albumin: 3.9 g/dL (ref 3.5–5.2)
Alkaline Phosphatase: 54 U/L (ref 39–117)
BUN: 10 mg/dL (ref 6–23)
CO2: 28 mEq/L (ref 19–32)
Calcium: 8.8 mg/dL (ref 8.4–10.5)
Chloride: 104 mEq/L (ref 96–112)
Creatinine, Ser: 0.62 mg/dL (ref 0.40–1.20)
GFR: 103.58 mL/min (ref >60.00)
Glucose, Bld: 75 mg/dL (ref 70–99)
Potassium: 3.9 mEq/L (ref 3.5–5.1)
Sodium: 137 mEq/L (ref 135–145)
Total Bilirubin: 0.4 mg/dL (ref 0.2–1.2)
Total Protein: 7 g/dL (ref 6.0–8.3)

## 2023-02-01 LAB — LIPID PANEL
Cholesterol: 208 mg/dL — ABNORMAL HIGH (ref 0–200)
HDL: 79.8 mg/dL (ref >39.00)
LDL Cholesterol: 115 mg/dL — ABNORMAL HIGH (ref 0–99)
NonHDL: 128.19
Total CHOL/HDL Ratio: 3
Triglycerides: 67 mg/dL (ref 0.0–149.0)
VLDL: 13.4 mg/dL (ref 0.0–40.0)

## 2023-02-02 NOTE — Progress Notes (Signed)
No critical labs need to be addressed urgently. We will discuss labs in detail at upcoming office visit.   

## 2023-02-08 ENCOUNTER — Other Ambulatory Visit: Payer: Self-pay | Admitting: Family Medicine

## 2023-02-08 ENCOUNTER — Ambulatory Visit (INDEPENDENT_AMBULATORY_CARE_PROVIDER_SITE_OTHER): Payer: BC Managed Care – PPO | Admitting: Family Medicine

## 2023-02-08 ENCOUNTER — Encounter: Payer: Self-pay | Admitting: Family Medicine

## 2023-02-08 VITALS — BP 100/62 | HR 77 | Temp 98.3°F | Ht 68.75 in | Wt 226.4 lb

## 2023-02-08 DIAGNOSIS — M3500 Sicca syndrome, unspecified: Secondary | ICD-10-CM

## 2023-02-08 DIAGNOSIS — Z23 Encounter for immunization: Secondary | ICD-10-CM | POA: Diagnosis not present

## 2023-02-08 DIAGNOSIS — Z0000 Encounter for general adult medical examination without abnormal findings: Secondary | ICD-10-CM

## 2023-02-08 DIAGNOSIS — Z1231 Encounter for screening mammogram for malignant neoplasm of breast: Secondary | ICD-10-CM

## 2023-02-08 DIAGNOSIS — F321 Major depressive disorder, single episode, moderate: Secondary | ICD-10-CM | POA: Diagnosis not present

## 2023-02-08 NOTE — Assessment & Plan Note (Addendum)
Improved control.. continue fluoxetine 40 mg daily  Given continued insomnia.Marland Kitchen will start trazodone prn in addition to fluoxetine.      Fluoxetine 40 mg daily  Trazodone 50 mg po qHS

## 2023-02-08 NOTE — Assessment & Plan Note (Signed)
Encouraged exercise, weight loss, healthy eating habits. ? ?

## 2023-02-08 NOTE — Assessment & Plan Note (Signed)
Followed by Rheumatology

## 2023-02-08 NOTE — Patient Instructions (Signed)
Keep up the great work.  Check with GYN about when PAP due!

## 2023-02-08 NOTE — Progress Notes (Signed)
Patient ID: Jessica Aguirre, female    DOB: [birth year], 52 y.o.   MRN: [id number]  This visit was conducted in person.  BP 100/62 (BP Location: Left Arm, Patient Position: Sitting, Cuff Size: Large)   Pulse 77   Temp 98.3 F (36.8 C) (Temporal)   Ht 5' 8.75" (1.746 m)   Wt 226 lb 6 oz (102.7 kg)   LMP 01/30/2023   SpO2 95%   BMI 33.67 kg/m    CC:  Chief Complaint  Patient presents with   Annual Exam    Subjective:   HPI: Jessica Aguirre is a 52 y.o. female presenting on 02/08/2023 for Annual Exam  The patient presents for  complete physical and review of chronic health problems. He/She also has the following acute concerns today: none   50 lb weight loss in last year  She has been trying to exercise more now after hip replacement and watching  fast food, all gluten. Wt Readings from Last 3 Encounters:  02/08/23 226 lb 6 oz (102.7 kg)  05/11/22 233 lb 6 oz (105.9 kg)  01/19/22 258 lb 6.4 oz (117.2 kg)  Body mass index is 33.67 kg/m.   Reviewed labs in detail  Elevated Cholesterol:   On red rice   2 capsules daily ( 1200 mg daily) Lab Results  Component Value Date   CHOL 208 (H) 02/01/2023   HDL 79.80 02/01/2023   LDLCALC 115 (H) 02/01/2023   LDLDIRECT 142.3 01/09/2013   TRIG 67.0 02/01/2023   CHOLHDL 3 02/01/2023   The 10-year ASCVD risk score (Arnett DK, et al., 2019) is: 0.5%   Values used to calculate the score:     Age: 63 years     Sex: Female     Is Non-Hispanic African American: No     Diabetic: No     Tobacco smoker: No     Systolic Blood Pressure: 100 mmHg     Is BP treated: No     HDL Cholesterol: 79.8 mg/dL     Total Cholesterol: 208 mg/dL Using medications without problems:none Muscle aches: none Diet compliance: low carb diet.. see above Exercise:  improved  Other complaints:  MDD: Improved overall on trazodone and fluoxetine Flowsheet Row Office Visit from 02/08/2023 in [facility] Total  Score 0          Followed by rheumatology for Sjogren's and Raynaud's       Relevant past medical, surgical, family and social history reviewed and updated as indicated. Interim medical history since our last visit reviewed. Allergies and medications reviewed and updated. Outpatient Medications Prior to Visit  Medication Sig Dispense Refill   Ascorbic Acid (VITAMIN C) 1000 MG tablet Take 1,000 mg by mouth daily.     Calcium Citrate 200 MG TABS Take 1 each by mouth daily.     Cholecalciferol 25 MCG (1000 UT) CHEW Chew by mouth.     COD LIVER OIL PO Take 1 tablet by mouth daily.     ferrous sulfate 325 (65 FE) MG tablet TAKE 1 TABLET BY MOUTH IN THE MORNING AND AT BEDTIME 180 tablet 1   FLUoxetine (PROZAC) 20 MG tablet TAKE 2 TABLETS BY MOUTH EVERY DAY 180 tablet 1   fluticasone (FLONASE) 50 MCG/ACT nasal spray PLACE 1 SPRAY INTO BOTH NOSTRILS 2 (TWO) TIMES DAILY 48 mL 0   hydroxychloroquine (PLAQUENIL) 200 MG tablet Take by mouth 2 (two) times daily.  MAGNESIUM PO Take 500 mg by mouth daily.     multivitamin-iron-minerals-folic acid (CENTRUM) chewable tablet Chew 2 tablets by mouth daily.     NIFEdipine (PROCARDIA-XL/NIFEDICAL-XL) 30 MG 24 hr tablet TAKE 1 TABLET BY MOUTH EVERY DAY 90 tablet 1   pilocarpine (SALAGEN) 5 MG tablet Take 5 mg by mouth 3 (three) times daily.     traZODone (DESYREL) 100 MG tablet Take 1 tablet (100 mg total) by mouth at bedtime. 90 tablet 1   No facility-administered medications prior to visit.     Per HPI unless specifically indicated in ROS section below Review of Systems  Constitutional:  Negative for fatigue and fever.  HENT:  Negative for congestion.   Eyes:  Negative for pain.  Respiratory:  Negative for cough and shortness of breath.   Cardiovascular:  Negative for chest pain, palpitations and leg swelling.  Gastrointestinal:  Negative for abdominal pain.  Genitourinary:  Negative for dysuria and vaginal bleeding.  Musculoskeletal:   Negative for back pain.  Neurological:  Negative for syncope, light-headedness and headaches.  Psychiatric/Behavioral:  Negative for dysphoric mood.    Objective:  BP 100/62 (BP Location: Left Arm, Patient Position: Sitting, Cuff Size: Large)   Pulse 77   Temp 98.3 F (36.8 C) (Temporal)   Ht 5' 8.75" (1.746 m)   Wt 226 lb 6 oz (102.7 kg)   LMP 01/30/2023   SpO2 95%   BMI 33.67 kg/m   Wt Readings from Last 3 Encounters:  02/08/23 226 lb 6 oz (102.7 kg)  05/11/22 233 lb 6 oz (105.9 kg)  01/19/22 258 lb 6.4 oz (117.2 kg)      Physical Exam Vitals and nursing note reviewed.  Constitutional:      General: She is not in acute distress.    Appearance: Normal appearance. She is well-developed. She is not ill-appearing or toxic-appearing.  HENT:     Head: Normocephalic.     Right Ear: Hearing, tympanic membrane, ear canal and external ear normal.     Left Ear: Hearing, tympanic membrane, ear canal and external ear normal.     Nose: Nose normal.  Eyes:     General: Lids are normal. Lids are everted, no foreign bodies appreciated.     Conjunctiva/sclera: Conjunctivae normal.     Pupils: Pupils are equal, round, and reactive to light.  Neck:     Thyroid: No thyroid mass or thyromegaly.     Vascular: No carotid bruit.     Trachea: Trachea normal.  Cardiovascular:     Rate and Rhythm: Normal rate and regular rhythm.     Heart sounds: Normal heart sounds, S1 normal and S2 normal. No murmur heard.    No gallop.  Pulmonary:     Effort: Pulmonary effort is normal. No respiratory distress.     Breath sounds: Normal breath sounds. No wheezing, rhonchi or rales.  Abdominal:     General: Bowel sounds are normal. There is no distension or abdominal bruit.     Palpations: Abdomen is soft. There is no fluid wave or mass.     Tenderness: There is no abdominal tenderness. There is no guarding or rebound.     Hernia: No hernia is present.  Musculoskeletal:     Cervical back: Normal range of  motion and neck supple.  Lymphadenopathy:     Cervical: No cervical adenopathy.  Skin:    General: Skin is warm and dry.     Findings: No rash.  Neurological:  Mental Status: She is alert.     Cranial Nerves: No cranial nerve deficit.     Sensory: No sensory deficit.  Psychiatric:        Mood and Affect: Mood is not anxious or depressed.        Speech: Speech normal.        Behavior: Behavior normal. Behavior is cooperative.        Judgment: Judgment normal.       Results for orders placed or performed in visit on 02/01/23  Lipid panel  Result Value Ref Range   Cholesterol 208 (H) 0 - 200 mg/dL   Triglycerides 96.0 0.0 - 149.0 mg/dL   HDL 45.40 >98.11 mg/dL   VLDL 91.4 0.0 - 78.2 mg/dL   LDL Cholesterol 956 (H) 0 - 99 mg/dL   Total CHOL/HDL Ratio 3    NonHDL 128.19   Comprehensive metabolic panel  Result Value Ref Range   Sodium 137 135 - 145 mEq/L   Potassium 3.9 3.5 - 5.1 mEq/L   Chloride 104 96 - 112 mEq/L   CO2 28 19 - 32 mEq/L   Glucose, Bld 75 70 - 99 mg/dL   BUN 10 6 - 23 mg/dL   Creatinine, Ser 2.13 0.40 - 1.20 mg/dL   Total Bilirubin 0.4 0.2 - 1.2 mg/dL   Alkaline Phosphatase 54 39 - 117 U/L   AST 23 0 - 37 U/L   ALT 22 0 - 35 U/L   Total Protein 7.0 6.0 - 8.3 g/dL   Albumin 3.9 3.5 - 5.2 g/dL   GFR 086.57 >84.69 mL/min   Calcium 8.8 8.4 - 10.5 mg/dL     COVID 19 screen:  No recent travel or known exposure to COVID19 The patient denies respiratory symptoms of COVID 19 at this time. The importance of social distancing was discussed today.   Assessment and Plan   The patient's preventative maintenance and recommended screening tests for an annual wellness exam were reviewed in full today. Brought up to date unless services declined.  Counselled on the importance of diet, exercise, and its role in overall health and mortality. The patient's FH and SH was reviewed, including their home life, tobacco status, and drug and alcohol status.   pap/DVE:  per GYN, nml pap 2019,  Due, plan  Mammo:  01/2021 nml..  Scheduled February 13, 2023  vaccines: Uptodate with pneumonia vaccine given immunocomp, TDAP,  given flu  and shingles vaccine today  Nonsmoker Colon: no early  family colon cancer... 02/2021, normal recall in 10 years  No desire for STD testing.  1-2 wine glass a week, trying to limit  Hep c : done  Routine general medical examination at a health care facility  MDD (major depressive disorder), single episode, moderate (HCC) Assessment & Plan: Improved control.. continue fluoxetine 40 mg daily  Given continued insomnia.Marland Kitchen will start trazodone prn in addition to fluoxetine.      Fluoxetine 40 mg daily  Trazodone 50 mg po qHS   Sjogren's syndrome, with unspecified organ involvement [facility]) Assessment & Plan:  Followed by Rheumatology.        Kerby Nora, MD

## 2023-02-10 ENCOUNTER — Other Ambulatory Visit: Payer: Self-pay | Admitting: Family Medicine

## 2023-02-13 ENCOUNTER — Ambulatory Visit
Admission: RE | Admit: 2023-02-13 | Discharge: 2023-02-13 | Disposition: A | Discharge status: Home / Self Care | Payer: BC Managed Care – PPO | Source: Ambulatory Visit

## 2023-02-13 DIAGNOSIS — Z1231 Encounter for screening mammogram for malignant neoplasm of breast: Secondary | ICD-10-CM

## 2023-04-10 ENCOUNTER — Ambulatory Visit (INDEPENDENT_AMBULATORY_CARE_PROVIDER_SITE_OTHER): Payer: BC Managed Care – PPO

## 2023-04-10 DIAGNOSIS — Z23 Encounter for immunization: Secondary | ICD-10-CM | POA: Diagnosis not present

## 2023-04-10 NOTE — Progress Notes (Signed)
Per orders of Dr. Kerby Nora, injection of Shingles  given by Wilburn Cornelia in left deltoid. Patient tolerated injection well.

## 2023-04-17 ENCOUNTER — Other Ambulatory Visit: Payer: Self-pay | Admitting: Family Medicine

## 2023-04-20 ENCOUNTER — Other Ambulatory Visit: Payer: Self-pay | Admitting: Family Medicine

## 2023-05-08 ENCOUNTER — Other Ambulatory Visit (HOSPITAL_COMMUNITY)
Admission: RE | Admit: 2023-05-08 | Discharge: 2023-05-08 | Disposition: A | Discharge status: Home / Self Care | Payer: BC Managed Care – PPO | Source: Ambulatory Visit | Attending: Obstetrics & Gynecology | Admitting: Obstetrics & Gynecology

## 2023-05-08 ENCOUNTER — Ambulatory Visit: Payer: BC Managed Care – PPO | Admitting: Obstetrics & Gynecology

## 2023-05-08 ENCOUNTER — Encounter: Payer: Self-pay | Admitting: Obstetrics & Gynecology

## 2023-05-08 VITALS — BP 120/79 | HR 89 | Ht 69.0 in | Wt 228.6 lb

## 2023-05-08 DIAGNOSIS — Z01419 Encounter for gynecological examination (general) (routine) without abnormal findings: Secondary | ICD-10-CM | POA: Insufficient documentation

## 2023-05-08 DIAGNOSIS — Z1151 Encounter for screening for human papillomavirus (HPV): Secondary | ICD-10-CM

## 2023-05-08 NOTE — Progress Notes (Signed)
GYNECOLOGY ANNUAL PREVENTATIVE CARE ENCOUNTER NOTE  History:     Jessica Aguirre is a 52 y.o. F female here for a routine annual gynecologic exam.  Current complaints: none. Reports having perimenopausal symptoms, none debilitating.   Denies abnormal vaginal bleeding, discharge, pelvic pain, problems with intercourse or other gynecologic concerns.    Gynecologic History No LMP recorded. Unsure LMP, she is perimenopausal. No heavy bleeding.  Contraception: tubal ligation Last Pap: 02/27/2018. Result was normal with negative HPV Last Mammogram: 02/13/2023.  Result was normal Last Colonoscopy: 02/22/2021.  Result was benign  Obstetric History OB History  No obstetric history on file.    Past Medical History:  Diagnosis Date   Anemia    Hyperlipidemia    Reynolds syndrome (HCC)    Sjogren's disease (HCC)     Past Surgical History:  Procedure Laterality Date   c sections     3 times   CESAREAN SECTION WITH BILATERAL TUBAL LIGATION     CHOLECYSTECTOMY     TONSILLECTOMY      Current Outpatient Medications on File Prior to Visit  Medication Sig Dispense Refill   Ascorbic Acid (VITAMIN C) 1000 MG tablet Take 1,000 mg by mouth daily.     Calcium Citrate 200 MG TABS Take 1 each by mouth daily.     Cholecalciferol 25 MCG (1000 UT) CHEW Chew by mouth.     COD LIVER OIL PO Take 1 tablet by mouth daily.     ferrous sulfate 325 (65 FE) MG tablet TAKE 1 TABLET BY MOUTH IN THE MORNING AND IN THE EVENING 180 tablet 1   FLUoxetine (PROZAC) 20 MG tablet TAKE 2 TABLETS BY MOUTH EVERY DAY 180 tablet 1   fluticasone (FLONASE) 50 MCG/ACT nasal spray PLACE 1 SPRAY INTO BOTH NOSTRILS 2 (TWO) TIMES DAILY 48 mL 0   hydroxychloroquine (PLAQUENIL) 200 MG tablet Take by mouth 2 (two) times daily.     MAGNESIUM PO Take 500 mg by mouth daily.     multivitamin-iron-minerals-folic acid (CENTRUM) chewable tablet Chew 2 tablets by mouth daily.     NIFEdipine (PROCARDIA-XL/NIFEDICAL-XL) 30 MG 24 hr  tablet TAKE 1 TABLET BY MOUTH EVERY DAY 90 tablet 1   pilocarpine (SALAGEN) 5 MG tablet Take 5 mg by mouth 3 (three) times daily.     traZODone (DESYREL) 100 MG tablet TAKE 1 TABLET BY MOUTH EVERYDAY AT BEDTIME 90 tablet 1   No current facility-administered medications on file prior to visit.    Allergies  Allergen Reactions   Other     Gluten causes severe Gi upset.    Social History:  reports that she has never smoked. She has never used smokeless tobacco. She reports current alcohol use of about 7.0 standard drinks of alcohol per week. She reports that she does not use drugs.  Family History  Problem Relation Age of Onset   Sudden death Father    Obesity Father        patient was over 800 lbs   Hypertension Father    Diabetes Father    Diabetes Maternal Grandmother    Hypertension Maternal Grandmother    Hypertension Maternal Grandfather    Diabetes Maternal Grandfather    Diabetes Paternal Grandmother    Hypertension Paternal Grandmother    Colon cancer Neg Hx    Esophageal cancer Neg Hx    Rectal cancer Neg Hx    Stomach cancer Neg Hx    Colon polyps Neg Hx  The following portions of the patient's history were reviewed and updated as appropriate: allergies, current medications, past family history, past medical history, past social history, past surgical history and problem list.  Review of Systems Pertinent items noted in HPI and remainder of comprehensive ROS otherwise negative.  Physical Exam:  BP 120/79   Pulse 89   Ht 5\' 9"  (1.753 m)   Wt 228 lb 9.6 oz (103.7 kg)   BMI 33.76 kg/m  CONSTITUTIONAL: Well-developed, well-nourished female in no acute distress.  HENT:  Normocephalic, atraumatic, External right and left ear normal.  EYES: Conjunctivae and EOM are normal. Pupils are equal, round, and reactive to light. No scleral icterus.  NECK: Normal range of motion, supple, no masses.  Normal thyroid.  SKIN: Skin is warm and dry. No rash noted. Not  diaphoretic. No erythema. No pallor. MUSCULOSKELETAL: Normal range of motion. No tenderness.  No cyanosis, clubbing, or edema. NEUROLOGIC: Alert and oriented to person, place, and time. Normal reflexes, muscle tone coordination.  PSYCHIATRIC: Normal mood and affect. Normal behavior. Normal judgment and thought content. CARDIOVASCULAR: Normal heart rate noted, regular rhythm RESPIRATORY: Clear to auscultation bilaterally. Effort and breath sounds normal, no problems with respiration noted. BREASTS: Symmetric in size. No masses, tenderness, skin changes, nipple drainage, or lymphadenopathy bilaterally. Performed in the presence of a chaperone. ABDOMEN: Soft, no distention noted.  No tenderness, rebound or guarding.  PELVIC: Normal appearing external genitalia and urethral meatus; normal appearing vaginal mucosa and cervix.  No abnormal vaginal discharge noted.  Pap smear obtained.  Normal uterine size, no other palpable masses, no uterine or adnexal tenderness.  Performed in the presence of a chaperone.   Assessment and Plan:     1. Well woman exam with routine gynecological exam - Cytology - PAP Will follow up results of pap smear and manage accordingly. Mammogram and colon cancer screening up to date. Routine preventative health maintenance measures emphasized. Please refer to After Visit Summary for other counseling recommendations.      Jaynie Collins, MD, FACOG Obstetrician & Gynecologist, [facility] for [employer], [facility]

## 2023-05-10 LAB — CYTOLOGY - PAP
Comment: NEGATIVE
Diagnosis: NEGATIVE
High risk HPV: NEGATIVE

## 2023-07-22 ENCOUNTER — Encounter: Payer: Self-pay | Admitting: *Deleted

## 2023-07-22 ENCOUNTER — Ambulatory Visit
Admission: EM | Admit: 2023-07-22 | Discharge: 2023-07-22 | Disposition: A | Discharge status: Home / Self Care | Payer: 59

## 2023-07-22 DIAGNOSIS — J069 Acute upper respiratory infection, unspecified: Secondary | ICD-10-CM | POA: Diagnosis not present

## 2023-07-22 MED ORDER — PROMETHAZINE-DM 6.25-15 MG/5ML PO SYRP: 5.0000 mL | ORAL_SOLUTION | Freq: Four times a day (QID) | ORAL | 0 refills | Status: AC | PRN

## 2023-07-22 NOTE — ED Triage Notes (Signed)
 Patient states cough and headache for 3 days, cough dry and worse at night.  Taking OTC Robitussin

## 2023-07-22 NOTE — ED Provider Notes (Signed)
 Renaldo Fiddler    CSN: [id number] Arrival date & time: 07/22/23  1008      History   Chief Complaint Chief Complaint  Patient presents with   Cough   Headache    HPI Kalene L Bueso is a 53 y.o. female.   53 year old female, Kayslee Cushman, presents to urgent care for evaluation of dry cough and headache for 3 days, cough worse at night. Pt is taking Robitussin without relief, unknown illness exposure.   The history is provided by the patient. No language interpreter was used.    Past Medical History:  Diagnosis Date   Anemia    Hyperlipidemia    Reynolds syndrome (HCC)    Sjogren's disease (HCC)     Patient Active Problem List   Diagnosis Date Noted   Night sweats 06/12/2022   Preoperative clearance 12/16/2021   MDD (major depressive disorder), single episode, moderate (HCC) 08/07/2019   Viral URI with cough 10/24/2013   S/P bariatric surgery 02/20/2013   Sjogren's syndrome (HCC) 01/09/2013   Raynaud's disease 01/09/2013   High cholesterol 01/09/2013   BACK PAIN, LUMBAR, WITH RADICULOPATHY 07/29/2009   Class 1 obesity with serious comorbidity and body mass index (BMI) of 33.0 to 33.9 in adult 10/16/2008    Past Surgical History:  Procedure Laterality Date   c sections     3 times   CESAREAN SECTION WITH BILATERAL TUBAL LIGATION     CHOLECYSTECTOMY     TONSILLECTOMY      OB History   No obstetric history on file.      Home Medications    Prior to Admission medications   Medication Sig Start Date End Date Taking? Authorizing Provider  promethazine-dextromethorphan (PROMETHAZINE-DM) 6.25-15 MG/5ML syrup Take 5 mLs by mouth 4 (four) times daily as needed for cough. 07/22/23  Yes Eliga Arvie, Para March, NP  topiramate (TOPAMAX) 25 MG tablet Take by mouth. 06/20/23  Yes [provider]  Ascorbic Acid (VITAMIN C) 1000 MG tablet Take 1,000 mg by mouth daily.    [provider]  Calcium Citrate 200 MG TABS Take 1 each by mouth  daily.    [provider]  Cholecalciferol 25 MCG (1000 UT) CHEW Chew by mouth.    [provider]  COD LIVER OIL PO Take 1 tablet by mouth daily.    [provider]  ferrous sulfate 325 (65 FE) MG tablet TAKE 1 TABLET BY MOUTH IN THE MORNING AND IN THE EVENING 04/20/23   Bedsole, Amy E, MD  FLUoxetine (PROZAC) 20 MG tablet TAKE 2 TABLETS BY MOUTH EVERY DAY 04/17/23   Bedsole, Amy E, MD  fluticasone (FLONASE) 50 MCG/ACT nasal spray PLACE 1 SPRAY INTO BOTH NOSTRILS 2 (TWO) TIMES DAILY 01/08/23   Bedsole, Amy E, MD  hydroxychloroquine (PLAQUENIL) 200 MG tablet Take by mouth 2 (two) times daily.    [provider]  MAGNESIUM PO Take 500 mg by mouth daily.    [provider]  multivitamin-iron-minerals-folic acid (CENTRUM) chewable tablet Chew 2 tablets by mouth daily.    [provider]  NIFEdipine (PROCARDIA-XL/NIFEDICAL-XL) 30 MG 24 hr tablet TAKE 1 TABLET BY MOUTH EVERY DAY 08/22/18   Bedsole, Amy E, MD  pilocarpine (SALAGEN) 5 MG tablet Take 5 mg by mouth 3 (three) times daily. 11/18/20   [provider]  traZODone (DESYREL) 100 MG tablet TAKE 1 TABLET BY MOUTH EVERYDAY AT BEDTIME 02/12/23   Excell Seltzer, MD    Family History Family History  Problem Relation Age of Onset   Sudden death Father    Obesity Father        patient was over 800 lbs   Hypertension Father    Diabetes Father    Diabetes Maternal Grandmother    Hypertension Maternal Grandmother    Hypertension Maternal Grandfather    Diabetes Maternal Grandfather    Diabetes Paternal Grandmother    Hypertension Paternal Grandmother    Colon cancer Neg Hx    Esophageal cancer Neg Hx    Rectal cancer Neg Hx    Stomach cancer Neg Hx    Colon polyps Neg Hx     Social History Social History   Tobacco Use   Smoking status: Never   Smokeless tobacco: Never  Vaping Use   Vaping status: Never Used  Substance Use Topics   Alcohol use: Not Currently    Comment:  occassional   Drug use: No     Allergies   Other   Review of Systems Review of Systems  Constitutional:  Negative for fever.  HENT:  Positive for congestion.   Respiratory:  Positive for cough.   Neurological:  Positive for headaches.  All other systems reviewed and are negative.    Physical Exam Triage Vital Signs ED Triage Vitals [07/22/23 1038]  Encounter Vitals Group     BP      Systolic BP Percentile      Diastolic BP Percentile      Pulse      Resp      Temp      Temp src      SpO2      Weight 219 lb (99.3 kg)     Height 5\' 9"  (1.753 m)     Head Circumference      Peak Flow      Pain Score 0     Pain Loc      Pain Education      Exclude from Growth Chart    No data found.  Updated Vital Signs BP 102/66 (BP Location: Left Arm)   Pulse 70   Temp 98.5 F (36.9 C) (Oral)   Resp 18   Ht 5\' 9"  (1.753 m)   Wt 219 lb (99.3 kg)   LMP 07/01/2023 (Exact Date)   SpO2 98%   BMI 32.34 kg/m   Visual Acuity Right Eye Distance:   Left Eye Distance:   Bilateral Distance:    Right Eye Near:   Left Eye Near:    Bilateral Near:     Physical Exam Vitals and nursing note reviewed.  Constitutional:      General: She is not in acute distress.    Appearance: She is well-developed.  HENT:     Head: Normocephalic.     Right Ear: Tympanic membrane is retracted.     Left Ear: Tympanic membrane is retracted.     Nose: Congestion present.     Mouth/Throat:     Lips: Pink.     Mouth: Mucous membranes are moist.     Pharynx: Oropharynx is clear.  Eyes:     General: Lids are normal.     Conjunctiva/sclera: Conjunctivae normal.     Pupils: Pupils are equal, round, and reactive to light.  Neck:     Trachea: No tracheal deviation.  Cardiovascular:     Rate and Rhythm: Normal rate and regular rhythm.     Pulses: Normal pulses.     Heart sounds: Normal heart sounds.  Pulmonary:  Effort: Pulmonary effort is normal.     Breath sounds: Normal breath sounds and  air entry.  Abdominal:     General: Bowel sounds are normal.     Palpations: Abdomen is soft.     Tenderness: There is no abdominal tenderness.  Musculoskeletal:        General: Normal range of motion.     Cervical back: Normal range of motion.  Lymphadenopathy:     Cervical: No cervical adenopathy.  Skin:    General: Skin is warm and dry.     Findings: No rash.  Neurological:     General: No focal deficit present.     Mental Status: She is alert and oriented to person, place, and time.     GCS: GCS eye subscore is 4. GCS verbal subscore is 5. GCS motor subscore is 6.     Cranial Nerves: No cranial nerve deficit.     Sensory: No sensory deficit.  Psychiatric:        Speech: Speech normal.        Behavior: Behavior normal. Behavior is cooperative.      UC Treatments / Results  Labs (all labs ordered are listed, but only abnormal results are displayed) Labs Reviewed - No data to display  EKG   Radiology No results found.  Procedures Procedures (including critical care time)  Medications Ordered in UC Medications - No data to display  Initial Impression / Assessment and Plan / UC Course  I have reviewed the triage vital signs and the nursing notes.  Pertinent labs & imaging results that were available during my care of the patient were reviewed by me and considered in my medical decision making (see chart for details).    Discussed exam findings and plan of care with patient, strict go to ER precautions given.   Patient verbalized understanding to this provider.  Ddx: Viral URI w cough,allergies Final Clinical Impressions(s) / UC Diagnoses   Final diagnoses:  Viral URI with cough     Discharge Instructions      Most likely you have a viral illness: no antibiotic is indicated at this time May treat with OTC meds of choice(mucinex, tylenol,ibuprofen,chloraseptic throat lozenges).  Take phenergan DM as directed for cough,drowsiness precautions Make sure to  drink plenty of fluids to stay hydrated(gatorade, water, popsicles,jello,etc), avoid caffeine products.  Follow up with PCP. Return as needed.     ED Prescriptions     Medication Sig Dispense Auth. Provider   promethazine-dextromethorphan (PROMETHAZINE-DM) 6.25-15 MG/5ML syrup Take 5 mLs by mouth 4 (four) times daily as needed for cough. 118 mL Chany Woolworth, Para March, NP      PDMP not reviewed this encounter.   Clancy Gourd, NP 07/22/23 1143

## 2023-07-22 NOTE — Discharge Instructions (Addendum)
 Most likely you have a viral illness: no antibiotic is indicated at this time May treat with OTC meds of choice(mucinex, tylenol,ibuprofen,chloraseptic throat lozenges).  Take phenergan DM as directed for cough,drowsiness precautions Make sure to drink plenty of fluids to stay hydrated(gatorade, water, popsicles,jello,etc), avoid caffeine products.  Follow up with PCP. Return as needed.

## 2023-08-09 ENCOUNTER — Ambulatory Visit: Admitting: Family Medicine

## 2023-08-09 ENCOUNTER — Encounter: Payer: Self-pay | Admitting: Family Medicine

## 2023-08-09 VITALS — BP 98/60 | Temp 97.4°F | Ht 69.0 in | Wt 225.0 lb

## 2023-08-09 DIAGNOSIS — Z79899 Other long term (current) drug therapy: Secondary | ICD-10-CM

## 2023-08-09 DIAGNOSIS — R053 Chronic cough: Secondary | ICD-10-CM | POA: Diagnosis not present

## 2023-08-09 DIAGNOSIS — D84821 Immunodeficiency due to drugs: Secondary | ICD-10-CM

## 2023-08-09 MED ORDER — PREDNISONE 20 MG PO TABS: ORAL_TABLET | ORAL | 0 refills | Status: AC

## 2023-08-09 MED ORDER — AZITHROMYCIN 250 MG PO TABS: ORAL_TABLET | ORAL | 0 refills | Status: DC

## 2023-08-09 MED ORDER — BENZONATATE 200 MG PO CAPS: 200.0000 mg | ORAL_CAPSULE | Freq: Two times a day (BID) | ORAL | 0 refills | Status: AC | PRN

## 2023-08-09 NOTE — Assessment & Plan Note (Signed)
 Acute, symptoms most consistent with possible bacterial bronchitis versus postinfectious bronchospasm versus occult reflux. Will treat with azithromycin to cover for bacterial component given she is immunocompromised on Plaquenil.  Lung exam is clear send no clear indication for chest x-ray.  We will also treat with prednisone taper to decrease bronchospasm.  She can use benzonatate 200 mg p.o. 3 times daily for cough. Given there is a possible reflux component versus posttussive emesis..  She can try omeprazole 20 mg daily to see if some of symptoms are improved.  Return and ER precautions provided.  If she is not improving with this treatment course we will move forward with a chest x-ray.

## 2023-08-09 NOTE — Progress Notes (Signed)
 Patient ID: Jessica Aguirre, female    DOB: Apr 28, 1971, 53 y.o.   MRN: [id number]  This visit was conducted in person.  BP 98/60   Temp (!) 97.4 F (36.3 C) (Temporal)   Ht 5\' 9"  (1.753 m)   Wt 225 lb (102.1 kg)   LMP 07/26/2023 (Exact Date)   BMI 33.23 kg/m    CC:  Chief Complaint  Patient presents with   Cough    Dry cough x 3 weeks  Chest heaviness and fatigued     Subjective:   HPI: Jessica Aguirre is a 53 y.o. female presenting on 08/09/2023 for Cough (Dry cough x 3 weeks /Chest heaviness and fatigued )   Seen at urgent care 07/22/2023.Marland Kitchen given cough suppressant  Dx with viral infeciton.  Date of onset: 3 weeks Initial symptoms included  bad cough,  ill feeing  Symptoms progressed to dry cough, chest heaviness and fatigue  Feels like something sitting on her chest  on left.. constant... not worse with deep breaths.   Coughing keeping you up at night., no wheeze  No fever  No Fever.  No SOB    One night woke up with  burning in throat and threw up mucus.  Sick contacts:  none COVID testing:    neg home COVID/FLU A, B     She has tried to treat with  cough suppressant  Muceinez, robitussin.    No history of chronic lung disease such as asthma or COPD. Non-smoker.    History of Sjogren's syndrome, Raynaud's disease, obesity. On Plaquenil: Immunocompromised.  Relevant past medical, surgical, family and social history reviewed and updated as indicated. Interim medical history since our last visit reviewed. Allergies and medications reviewed and updated. Outpatient Medications Prior to Visit  Medication Sig Dispense Refill   Ascorbic Acid (VITAMIN C) 1000 MG tablet Take 1,000 mg by mouth daily.     Calcium Citrate 200 MG TABS Take 1 each by mouth daily.     Cholecalciferol 25 MCG (1000 UT) CHEW Chew by mouth.     COD LIVER OIL PO Take 1 tablet by mouth daily.     ferrous sulfate 325 (65 FE) MG tablet TAKE 1 TABLET BY MOUTH IN THE MORNING AND IN THE  EVENING 180 tablet 1   FLUoxetine (PROZAC) 20 MG tablet TAKE 2 TABLETS BY MOUTH EVERY DAY 180 tablet 1   fluticasone (FLONASE) 50 MCG/ACT nasal spray PLACE 1 SPRAY INTO BOTH NOSTRILS 2 (TWO) TIMES DAILY 48 mL 0   hydroxychloroquine (PLAQUENIL) 200 MG tablet Take by mouth 2 (two) times daily.     MAGNESIUM PO Take 500 mg by mouth daily.     multivitamin-iron-minerals-folic acid (CENTRUM) chewable tablet Chew 2 tablets by mouth daily.     NIFEdipine (PROCARDIA-XL/NIFEDICAL-XL) 30 MG 24 hr tablet TAKE 1 TABLET BY MOUTH EVERY DAY 90 tablet 1   pilocarpine (SALAGEN) 5 MG tablet Take 5 mg by mouth 3 (three) times daily.     promethazine-dextromethorphan (PROMETHAZINE-DM) 6.25-15 MG/5ML syrup Take 5 mLs by mouth 4 (four) times daily as needed for cough. 118 mL 0   topiramate (TOPAMAX) 25 MG tablet Take by mouth.     traZODone (DESYREL) 100 MG tablet TAKE 1 TABLET BY MOUTH EVERYDAY AT BEDTIME 90 tablet 1   No facility-administered medications prior to visit.     Per HPI unless specifically indicated in ROS section below Review of Systems  Constitutional:  Positive for fatigue. Negative for fever.  HENT:  Negative for congestion.   Eyes:  Negative for pain.  Respiratory:  Positive for cough. Negative for shortness of breath.   Cardiovascular:  Positive for chest pain. Negative for palpitations and leg swelling.  Gastrointestinal:  Negative for abdominal pain.  Genitourinary:  Negative for dysuria and vaginal bleeding.  Musculoskeletal:  Negative for back pain.  Neurological:  Negative for syncope, light-headedness and headaches.  Psychiatric/Behavioral:  Negative for dysphoric mood.    Objective:  BP 98/60   Temp (!) 97.4 F (36.3 C) (Temporal)   Ht 5\' 9"  (1.753 m)   Wt 225 lb (102.1 kg)   LMP 07/26/2023 (Exact Date)   BMI 33.23 kg/m   Wt Readings from Last 3 Encounters:  08/09/23 225 lb (102.1 kg)  07/22/23 219 lb (99.3 kg)  05/08/23 228 lb 9.6 oz (103.7 kg)      Physical  Exam Constitutional:      General: She is not in acute distress.    Appearance: Normal appearance. She is well-developed. She is not ill-appearing or toxic-appearing.  HENT:     Head: Normocephalic.     Right Ear: Hearing, tympanic membrane, ear canal and external ear normal. Tympanic membrane is not erythematous, retracted or bulging.     Left Ear: Hearing, tympanic membrane, ear canal and external ear normal. Tympanic membrane is not erythematous, retracted or bulging.     Nose: No mucosal edema or rhinorrhea.     Right Sinus: No maxillary sinus tenderness or frontal sinus tenderness.     Left Sinus: No maxillary sinus tenderness or frontal sinus tenderness.     Mouth/Throat:     Pharynx: Uvula midline.  Eyes:     General: Lids are normal. Lids are everted, no foreign bodies appreciated.     Conjunctiva/sclera: Conjunctivae normal.     Pupils: Pupils are equal, round, and reactive to light.  Neck:     Thyroid: No thyroid mass or thyromegaly.     Vascular: No carotid bruit.     Trachea: Trachea normal.  Cardiovascular:     Rate and Rhythm: Normal rate and regular rhythm.     Pulses: Normal pulses.     Heart sounds: Normal heart sounds, S1 normal and S2 normal. No murmur heard.    No friction rub. No gallop.  Pulmonary:     Effort: Pulmonary effort is normal. No tachypnea or respiratory distress.     Breath sounds: Normal breath sounds. No decreased breath sounds, wheezing, rhonchi or rales.  Chest:     Chest wall: Tenderness present.       Comments: ttp Abdominal:     General: Bowel sounds are normal.     Palpations: Abdomen is soft.     Tenderness: There is no abdominal tenderness.  Musculoskeletal:     Cervical back: Normal range of motion and neck supple.  Skin:    General: Skin is warm and dry.     Findings: No rash.  Neurological:     Mental Status: She is alert.  Psychiatric:        Mood and Affect: Mood is not anxious or depressed.        Speech: Speech normal.         Behavior: Behavior normal. Behavior is cooperative.        Thought Content: Thought content normal.        Judgment: Judgment normal.       Results for orders placed or performed in visit on 05/08/23  Cytology - PAP  Collection Time: 05/08/23  9:27 AM  Result Value Ref Range   High risk HPV Negative    Adequacy      Satisfactory for evaluation; transformation zone component PRESENT.   Diagnosis      - Negative for intraepithelial lesion or malignancy (NILM)   Comment Normal Reference Range HPV - Negative     Assessment and Plan  Persistent cough for 3 weeks or longer Assessment & Plan: Acute, symptoms most consistent with possible bacterial bronchitis versus postinfectious bronchospasm versus occult reflux. Will treat with azithromycin to cover for bacterial component given she is immunocompromised on Plaquenil.  Lung exam is clear send no clear indication for chest x-ray.  We will also treat with prednisone taper to decrease bronchospasm.  She can use benzonatate 200 mg p.o. 3 times daily for cough. Given there is a possible reflux component versus posttussive emesis..  She can try omeprazole 20 mg daily to see if some of symptoms are improved.  Return and ER precautions provided.  If she is not improving with this treatment course we will move forward with a chest x-ray.   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  Dispense: 20 capsule; Refill: 0    No follow-ups on file.   Kerby Nora, MD

## 2023-08-09 NOTE — Patient Instructions (Signed)
 Can also try a trial if omeprazole ( prilosec 20 mg daily) for possible reflux component.  Avoid trigger.

## 2023-08-11 ENCOUNTER — Other Ambulatory Visit: Payer: Self-pay | Admitting: Family Medicine

## 2023-08-14 ENCOUNTER — Encounter: Payer: Self-pay | Admitting: Family Medicine

## 2023-08-16 ENCOUNTER — Ambulatory Visit: Admitting: Family Medicine

## 2023-08-17 ENCOUNTER — Other Ambulatory Visit: Payer: Self-pay | Admitting: Family Medicine

## 2023-08-17 ENCOUNTER — Ambulatory Visit: Admitting: Family Medicine

## 2023-08-17 ENCOUNTER — Ambulatory Visit (INDEPENDENT_AMBULATORY_CARE_PROVIDER_SITE_OTHER)
Admission: RE | Admit: 2023-08-17 | Discharge: 2023-08-17 | Disposition: A | Discharge status: Home / Self Care | Source: Ambulatory Visit | Attending: Family Medicine | Admitting: Family Medicine

## 2023-08-17 ENCOUNTER — Encounter: Payer: Self-pay | Admitting: Family Medicine

## 2023-08-17 VITALS — BP 100/72 | HR 70 | Temp 98.0°F | Ht 69.0 in

## 2023-08-17 DIAGNOSIS — R0789 Other chest pain: Secondary | ICD-10-CM

## 2023-08-17 DIAGNOSIS — R053 Chronic cough: Secondary | ICD-10-CM

## 2023-08-17 LAB — COMPREHENSIVE METABOLIC PANEL
ALT: 36 U/L — ABNORMAL HIGH (ref 0–35)
AST: 26 U/L (ref 0–37)
Albumin: 4.6 g/dL (ref 3.5–5.2)
Alkaline Phosphatase: 56 U/L (ref 39–117)
BUN: 18 mg/dL (ref 6–23)
CO2: 29 meq/L (ref 19–32)
Calcium: 9.3 mg/dL (ref 8.4–10.5)
Chloride: 100 meq/L (ref 96–112)
Creatinine, Ser: 0.92 mg/dL (ref 0.40–1.20)
GFR: 72.19 mL/min (ref >60.00)
Glucose, Bld: 85 mg/dL (ref 70–99)
Potassium: 3.8 meq/L (ref 3.5–5.1)
Sodium: 136 meq/L (ref 135–145)
Total Bilirubin: 0.3 mg/dL (ref 0.2–1.2)
Total Protein: 7.8 g/dL (ref 6.0–8.3)

## 2023-08-17 LAB — BRAIN NATRIURETIC PEPTIDE: Pro B Natriuretic peptide (BNP): 12 pg/mL (ref 0.0–100.0)

## 2023-08-17 LAB — CBC WITH DIFFERENTIAL/PLATELET
Basophils Absolute: 0 10*3/uL (ref 0.0–0.1)
Basophils Relative: 0.4 % (ref 0.0–3.0)
Eosinophils Absolute: 0 10*3/uL (ref 0.0–0.7)
Eosinophils Relative: 0.6 % (ref 0.0–5.0)
HCT: 41 % (ref 36.0–46.0)
Hemoglobin: 13.4 g/dL (ref 12.0–15.0)
Lymphocytes Relative: 29.6 % (ref 12.0–46.0)
Lymphs Abs: 1.4 10*3/uL (ref 0.7–4.0)
MCHC: 32.7 g/dL (ref 30.0–36.0)
MCV: 86.5 fl (ref 78.0–100.0)
Monocytes Absolute: 0.6 10*3/uL (ref 0.1–1.0)
Monocytes Relative: 12 % (ref 3.0–12.0)
Neutro Abs: 2.8 10*3/uL (ref 1.4–7.7)
Neutrophils Relative %: 57.4 % (ref 43.0–77.0)
Platelets: 279 10*3/uL (ref 150.0–400.0)
RBC: 4.74 Mil/uL (ref 3.87–5.11)
RDW: 19.8 % — ABNORMAL HIGH (ref 11.5–15.5)
WBC: 4.8 10*3/uL (ref 4.0–10.5)

## 2023-08-17 MED ORDER — INDOMETHACIN 50 MG PO CAPS: 50.0000 mg | ORAL_CAPSULE | Freq: Three times a day (TID) | ORAL | 0 refills | Status: AC

## 2023-08-17 NOTE — Progress Notes (Signed)
 Patient ID: Jessica Aguirre, female    DOB: 1970-08-08, 53 y.o.   MRN: [id number]  This visit was conducted in person.  BP 100/72 (BP Location: Left Arm, Patient Position: Sitting, Cuff Size: Large)   Pulse 70   Temp 98 F (36.7 C) (Temporal)   Ht 5\' 9"  (1.753 m)   LMP 07/26/2023 (Exact Date)   SpO2 100%   BMI 33.23 kg/m    CC:  Chief Complaint  Patient presents with   Cough    Follow up-Seen on 08/09/23    Subjective:   HPI: Jessica Aguirre is a 53 y.o. 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 Patient seen by myself for persistent cough x 3 weeks. Patient treated with azithromycin course, prednisone taper and benzonatate for possible postinfectious bronchospasm spasm versus bronchitis.  Today she reports worsened central chest pressure.. constant.  Tenderness in anterior chest wall to palpation and deep breaths.   Continue cough, slightly better, dry.  Had another spell so post tussive emesis.  Feels like she cannot take a full breath.  ST from constant coughing  No fever. No swelling  Wt Readings from Last 3 Encounters:  08/09/23 225 lb (102.1 kg)  07/22/23 219 lb (99.3 kg)  05/08/23 228 lb 9.6 oz (103.7 kg)    No heartburn.     Relevant past medical, surgical, family and social history reviewed and updated as indicated. Interim medical history since our last visit reviewed. Allergies and medications reviewed and updated. Outpatient Medications Prior to Visit  Medication Sig Dispense Refill   Ascorbic Acid (VITAMIN C) 1000 MG tablet Take 1,000 mg by mouth daily.     benzonatate (TESSALON) 200 MG capsule Take 1 capsule (200 mg total) by mouth 2 (two) times daily as needed for cough. 20 capsule 0   Calcium Citrate 200 MG  TABS Take 1 each by mouth daily.     Cholecalciferol 25 MCG (1000 UT) CHEW Chew by mouth.     COD LIVER OIL PO Take 1 tablet by mouth daily.     ferrous sulfate 325 (65 FE) MG tablet TAKE 1 TABLET BY MOUTH IN THE MORNING AND IN THE EVENING 180 tablet 1   FLUoxetine (PROZAC) 20 MG tablet TAKE 2 TABLETS BY MOUTH EVERY DAY 180 tablet 1   fluticasone (FLONASE) 50 MCG/ACT nasal spray PLACE 1 SPRAY INTO BOTH NOSTRILS 2 (TWO) TIMES DAILY 48 mL 0   hydroxychloroquine (PLAQUENIL) 200 MG tablet Take by mouth 2 (two) times daily.     MAGNESIUM PO Take 500 mg by mouth daily.     multivitamin-iron-minerals-folic acid (CENTRUM) chewable tablet Chew 2 tablets by mouth daily.     NIFEdipine (PROCARDIA-XL/NIFEDICAL-XL) 30 MG 24 hr tablet TAKE 1 TABLET BY MOUTH EVERY DAY 90 tablet 1   pilocarpine (SALAGEN) 5 MG tablet Take 5 mg by mouth 3 (three) times daily.     predniSONE (DELTASONE) 20 MG tablet 3 tabs by mouth daily x 3 days, then 2 tabs by mouth daily x 2 days then 1 tab by mouth daily x 2 days 15 tablet 0   promethazine-dextromethorphan (PROMETHAZINE-DM) 6.25-15 MG/5ML syrup Take 5 mLs by mouth 4 (four) times daily as needed for cough. 118 mL 0   topiramate (TOPAMAX) 25 MG tablet Take by  mouth.     traZODone (DESYREL) 100 MG tablet TAKE 1 TABLET BY MOUTH EVERYDAY AT BEDTIME 90 tablet 1   azithromycin (ZITHROMAX) 250 MG tablet 2 tab po x 1 day then 1 tab po daily 6 tablet 0   No facility-administered medications prior to visit.     Per HPI unless specifically indicated in ROS section below Review of Systems  Constitutional:  Negative for fatigue and fever.  HENT:  Negative for congestion.   Eyes:  Negative for pain.  Respiratory:  Positive for cough. Negative for shortness of breath.   Cardiovascular:  Positive for chest pain. Negative for palpitations and leg swelling.  Gastrointestinal:  Negative for abdominal pain.  Genitourinary:  Negative for dysuria and vaginal bleeding.  Musculoskeletal:   Negative for back pain.  Neurological:  Negative for syncope, light-headedness and headaches.  Psychiatric/Behavioral:  Negative for dysphoric mood.    Objective:  BP 100/72 (BP Location: Left Arm, Patient Position: Sitting, Cuff Size: Large)   Pulse 70   Temp 98 F (36.7 C) (Temporal)   Ht 5\' 9"  (1.753 m)   LMP 07/26/2023 (Exact Date)   SpO2 100%   BMI 33.23 kg/m   Wt Readings from Last 3 Encounters:  08/09/23 225 lb (102.1 kg)  07/22/23 219 lb (99.3 kg)  05/08/23 228 lb 9.6 oz (103.7 kg)      Physical Exam Constitutional:      General: She is not in acute distress.    Appearance: Normal appearance. She is well-developed. She is obese. She is not ill-appearing or toxic-appearing.  HENT:     Head: Normocephalic.     Right Ear: Hearing, tympanic membrane, ear canal and external ear normal. Tympanic membrane is not erythematous, retracted or bulging.     Left Ear: Hearing, tympanic membrane, ear canal and external ear normal. Tympanic membrane is not erythematous, retracted or bulging.     Nose: No mucosal edema or rhinorrhea.     Right Sinus: No maxillary sinus tenderness or frontal sinus tenderness.     Left Sinus: No maxillary sinus tenderness or frontal sinus tenderness.     Mouth/Throat:     Pharynx: Uvula midline.  Eyes:     General: Lids are normal. Lids are everted, no foreign bodies appreciated.     Conjunctiva/sclera: Conjunctivae normal.     Pupils: Pupils are equal, round, and reactive to light.  Neck:     Thyroid: No thyroid mass or thyromegaly.     Vascular: No carotid bruit.     Trachea: Trachea normal.  Cardiovascular:     Rate and Rhythm: Normal rate and regular rhythm.     Pulses: Normal pulses.     Heart sounds: Normal heart sounds, S1 normal and S2 normal. No murmur heard.    No friction rub. No gallop.  Pulmonary:     Effort: Pulmonary effort is normal. No tachypnea or respiratory distress.     Breath sounds: Normal breath sounds. No decreased breath  sounds, wheezing, rhonchi or rales.     Comments: Frequent dry coughing fits Chest:     Chest wall: Tenderness present.    Abdominal:     General: Bowel sounds are normal.     Palpations: Abdomen is soft.     Tenderness: There is no abdominal tenderness.  Musculoskeletal:     Cervical back: Normal range of motion and neck supple.     Right lower leg: No edema.     Left lower leg: No edema.  Skin:    General: Skin is warm and dry.     Findings: No rash.  Neurological:     Mental Status: She is alert.  Psychiatric:        Mood and Affect: Mood is not anxious or depressed.        Speech: Speech normal.        Behavior: Behavior normal. Behavior is cooperative.        Thought Content: Thought content normal.        Judgment: Judgment normal.       Results for orders placed or performed in visit on 05/08/23  Cytology - PAP   Collection Time: 05/08/23  9:27 AM  Result Value Ref Range   High risk HPV Negative    Adequacy      Satisfactory for evaluation; transformation zone component PRESENT.   Diagnosis      - Negative for intraepithelial lesion or malignancy (NILM)   Comment Normal Reference Range HPV - Negative     Assessment and Plan  Continued anterior chest wall pain with pleuritic component ongoing approximately 1 month with no improvement with prednisone and azithromycin course. Will move forward with chest x-ray, lab evaluation including CBC, complete metabolic panel and BNP. To rule out pneumonia versus heart failure. Symptoms most consistent with viral pleurisy causing pain with deep breaths and continued cough.  I am surprised he did not improve with prednisone taper.  She can use ibuprofen 800 mg 3 times a day as needed for pain and inflammation.   We did briefly discuss pulmonary embolus on the differential but there is no hypoxia, normal respiratory rate she does not describe shortness of breath but instead unable to fill her lungs due to chest wall pain.  Also  EKG showed no changes.  We decided to hold off on D-dimer given high rate of false positive especially in patient with autoimmune disease. No clear evidence of cardiac source of atypical chest pain. She has no EKG: normal EKG, normal sinus rhythm, unchanged from previous tracings.  Labs were sent stat and I will contact the patient when upon their return.  If she has rapid decline, worsening chest pain or shortness of breath, she will go to the emergency room for further evaluation.   Atypical chest pain -     DG Chest 2 View; Future -     EKG 12-Lead -     Brain natriuretic peptide -     CBC with Differential/Platelet -     Comprehensive metabolic panel  Persistent cough -     DG Chest 2 View; Future -     Brain natriuretic peptide -     CBC with Differential/Platelet -     Comprehensive metabolic panel    No follow-ups on file.   Kerby Nora, MD

## 2024-01-30 ENCOUNTER — Other Ambulatory Visit: Payer: Self-pay | Admitting: Family Medicine

## 2024-01-30 NOTE — Telephone Encounter (Signed)
 Please schedule CPE with fasting labs for Dr. Avelina after 02/28/24.

## 2024-05-01 ENCOUNTER — Other Ambulatory Visit: Payer: Self-pay | Admitting: Family Medicine
